# Patient Record
Sex: Male | Born: 1990 | Race: Black or African American | Hispanic: No | Marital: Single | Smoking: Never smoker
Health system: Southern US, Community
[De-identification: ages and names within clinical notes are randomized; demographics above are authoritative.]

## PROBLEM LIST (undated history)

## (undated) DIAGNOSIS — J45909 Unspecified asthma, uncomplicated: Secondary | ICD-10-CM

---

## 2012-05-02 ENCOUNTER — Encounter (HOSPITAL_COMMUNITY): Payer: Self-pay | Admitting: Emergency Medicine

## 2012-05-02 ENCOUNTER — Emergency Department (HOSPITAL_COMMUNITY)
Admission: EM | Admit: 2012-05-02 | Discharge: 2012-05-02 | Disposition: A | Discharge status: Home / Self Care | Payer: 59 | Source: Home / Self Care | Attending: Family Medicine | Admitting: Family Medicine

## 2012-05-02 DIAGNOSIS — J45990 Exercise induced bronchospasm: Secondary | ICD-10-CM

## 2012-05-02 HISTORY — DX: Unspecified asthma, uncomplicated: J45.909

## 2012-05-02 MED ORDER — ALBUTEROL SULFATE HFA 108 (90 BASE) MCG/ACT IN AERS: 1.0000 | INHALATION_SPRAY | Freq: Four times a day (QID) | RESPIRATORY_TRACT | Status: DC | PRN

## 2012-05-02 NOTE — ED Notes (Signed)
Pt is needing refill on his ventolin Denies any medical problems He is alert w/no signs of acute distress.

## 2012-05-02 NOTE — ED Provider Notes (Signed)
History     CSN: [id number]  Arrival date & time 05/02/12  1412   First MD Initiated Contact with Patient 05/02/12 1422      Chief Complaint  Patient presents with  . Medication Refill    (Consider location/radiation/quality/duration/timing/severity/associated sxs/prior treatment) Patient is a 22 y.o. male presenting with shortness of breath. The history is provided by the patient and a parent.  Shortness of Breath  The current episode started today. The onset was gradual. Progression since onset: pt with EIA and asthma since child, , has run out of mdi. The problem is mild. Associated symptoms include shortness of breath and wheezing. Pertinent negatives include no cough.    Past Medical History  Diagnosis Date  . Asthma     History reviewed. No pertinent past surgical history.  No family history on file.  History  Substance Use Topics  . Smoking status: Never Smoker   . Smokeless tobacco: Not on file  . Alcohol Use: No      Review of Systems  Constitutional: Negative.   Respiratory: Positive for shortness of breath and wheezing. Negative for cough.     Allergies  Review of patient's allergies indicates no known allergies.  Home Medications   Current Outpatient Rx  Name  Route  Sig  Dispense  Refill  . ALBUTEROL SULFATE HFA 108 (90 BASE) MCG/ACT IN AERS   Inhalation   Inhale 2 puffs into the lungs every 6 (six) hours as needed.         . ALBUTEROL SULFATE HFA 108 (90 BASE) MCG/ACT IN AERS   Inhalation   Inhale 1-2 puffs into the lungs every 6 (six) hours as needed for wheezing. Or 30 minutes before exercise.   1 Inhaler   0     BP 135/77  Pulse 71  Temp 98.4 F (36.9 C) (Oral)  Resp 17  SpO2 100%  Physical Exam  Nursing note and vitals reviewed. Constitutional: He is oriented to person, place, and time. He appears well-developed and well-nourished.  Neck: Normal range of motion. Neck supple.  Pulmonary/Chest: Effort normal and breath sounds  normal.  Lymphadenopathy:    He has no cervical adenopathy.  Neurological: He is alert and oriented to person, place, and time.  Skin: Skin is warm and dry.    ED Course  Procedures (including critical care time)  Labs Reviewed - No data to display No results found.   1. Asthma, exercise induced       MDM          Linna Hoff, MD 05/02/12 1625

## 2012-10-01 ENCOUNTER — Encounter: Payer: Self-pay | Admitting: Internal Medicine

## 2012-10-01 ENCOUNTER — Ambulatory Visit (INDEPENDENT_AMBULATORY_CARE_PROVIDER_SITE_OTHER): Payer: 59 | Admitting: Internal Medicine

## 2012-10-01 VITALS — BP 122/80 | HR 57 | Temp 98.4°F | Resp 18 | Ht 69.0 in | Wt 157.0 lb

## 2012-10-01 DIAGNOSIS — J45990 Exercise induced bronchospasm: Secondary | ICD-10-CM

## 2012-10-01 DIAGNOSIS — Z0000 Encounter for general adult medical examination without abnormal findings: Secondary | ICD-10-CM

## 2012-10-01 MED ORDER — ALBUTEROL SULFATE HFA 108 (90 BASE) MCG/ACT IN AERS: 1.0000 | INHALATION_SPRAY | Freq: Four times a day (QID) | RESPIRATORY_TRACT | Status: DC | PRN

## 2012-10-01 NOTE — Patient Instructions (Signed)
Health Maintenance, 66- to 20-Year-Old SCHOOL PERFORMANCE After high school completion, the young adult may be attending college, Scientist, product/process development or vocational school, or entering the [employer] or the work force. SOCIAL AND EMOTIONAL DEVELOPMENT The young adult establishes adult relationships and explores sexual identity. Young adults may be living at home or in a college dorm or apartment. Increasing independence is important with young adults. Throughout adolescence, teens should assume responsibility of their own health care. IMMUNIZATIONS Most young adults should be fully vaccinated. A booster dose of Tdap (tetanus, diphtheria, and pertussis, or "whooping cough"), a dose of meningococcal vaccine to protect against a certain type of bacterial meningitis, hepatitis A, human papillomarvirus (HPV), chickenpox, or measles vaccines may be indicated, if not given at an earlier age. Annual influenza or "flu" vaccination should be considered during flu season.  TESTING Annual screening for vision and hearing problems is recommended. Vision should be screened objectively at least once between 45 and 51 years of age. The young adult may be screened for anemia or tuberculosis. Young adults should have a blood test to check for high cholesterol during this time period. Young adults should be screened for use of alcohol and drugs. If the young adult is sexually active, screening for sexually transmitted infections, pregnancy, or HIV may be performed. Screening for cervical cancer should be performed within 3 years of beginning sexual activity. NUTRITION AND ORAL HEALTH  Adequate calcium intake is important. Consume 3 servings of low-fat milk and dairy products daily. For those who do not drink milk or consume dairy products, calcium enriched foods, such as juice, bread, or cereal, dark, leafy greens, or canned fish are alternate sources of calcium.  Drink plenty of water. Limit fruit juice to 8 to 12 ounces per day.  Avoid sugary beverages or sodas.  Discourage skipping meals, especially breakfast. Teens should eat a good variety of vegetables and fruits, as well as lean meats.  Avoid high fat, high salt, and high sugar foods, such as candy, chips, and cookies.  Encourage young adults to participate in meal planning and preparation.  Eat meals together as a family whenever possible. Encourage conversation at mealtime.  Limit fast food choices and eating out at restaurants.  Brush teeth twice a day and floss.  Schedule dental exams twice a year. SLEEP Regular sleep habits are important. PHYSICAL, SOCIAL, AND EMOTIONAL DEVELOPMENT  One hour of regular physical activity daily is recommended. Continue to participate in sports.  Encourage young adults to develop their own interests and consider community service or volunteerism.  Provide guidance to the young adult in making decisions about college and work plans.  Make sure that young adults know that they should never be in a situation that makes them uncomfortable, and they should tell partners if they do not want to engage in sexual activity.  Talk to the young adult about body image. Eating disorders may be noted at this time. Young adults may also be concerned about being overweight. Monitor the young adult for weight gain or loss.  Mood disturbances, depression, anxiety, alcoholism, or attention problems may be noted in young adults. Talk to the caregiver if there are concerns about mental illness.  Negotiate limit setting and independent decision making.  Encourage the young adult to handle conflict without physical violence.  Avoid loud noises which may impair hearing.  Limit television and computer time to 2 hours per day. Individuals who engage in excessive sedentary activity are more likely to become overweight. RISK BEHAVIORS  Sexually active  young adults need to take precautions against pregnancy and sexually transmitted  infections. Talk to young adults about contraception.  Provide a tobacco-free and drug-free environment for the young adult. Talk to the young adult about drug, tobacco, and alcohol use among friends or at friends' homes. Make sure the young adult knows that smoking tobacco or marijuana and taking drugs have health consequences and may impact brain development.  Teach the young adult about appropriate use of over-the-counter or prescription medicines.  Establish guidelines for driving and for riding with friends.  Talk to young adults about the risks of drinking and driving or boating. Encourage the young adult to call you if he or she or friends have been drinking or using drugs.  Remind young adults to wear seat belts at all times in cars and life vests in boats.  Young adults should always wear a properly fitted helmet when they are riding a bicycle.  Use caution with all-terrain vehicles (ATVs) or other motorized vehicles.  Do not keep handguns in the home. (If you do, the gun and ammunition should be locked separately and out of the young adult's access.)  Equip your home with smoke detectors and change the batteries regularly. Make sure all family members know the fire escape plans for your home.  Teach young adults not to swim alone and not to dive in shallow water.  All individuals should wear sunscreen that protects against UVA and UVB light with at least a sun protection factor (SPF) of 30 when out in the sun. This minimizes sun burning. WHAT'S NEXT? Young adults should visit their pediatrician or family physician yearly. By young adulthood, health care should be transitioned to a family physician or internal medicine specialist. Sexually active females may want to begin annual physical exams with a gynecologist. Document Released: 06/20/2006 Document Revised: 06/17/2011 Document Reviewed: 07/10/2006 [facility] Patient Information 2014 [place], [place]. Exercise-Induced Asthma   Asthma is a condition in which the airways in the lungs (bronchioles) tend to constrict more than normal due to muscle spasms. This constriction results in difficulty in breathing (shortness of breath, wheezing, or coughing). For some people the symptoms are caused or triggered by physical activity; this is known as exercise-induced asthma. SYMPTOMS   Shortness of breath.  Wheezing.  Coughing.  Chest tightness.  Decrease in optimal performance.  Fatigue. POSSIBLE TRIGGERS: Exercise-induced asthma may occur more often when one or more of the following are present:   Animal dander from the skin, hair, or feathers of animals.  Dust mites contained in house dust.  Cockroaches.  Pollen from trees or grass.  Mold.  Cigarette or tobacco smoke. Smoking cannot be allowed in homes of people with asthma. People with asthma should not smoke and should not be around smokers.  Air pollutants such as dust, household cleaners, hair sprays, aerosol sprays, paint fumes, strong chemicals, or strong odors.  Cold air or weather changes. Cold air may cause inflammation. Winds increase molds and pollens in the air. There is not one best climate for people with asthma.  Strong emotions such as crying or laughing hard.  Stress.  Certain medicines such as aspirin or beta-blockers.  Sulfites in such foods and drinks as dried fruits and wine.  Infections or inflammatory conditions such as the flu, a cold, or an inflammation of the nasal membranes (rhinitis).  Gastroesophageal reflux disease (GERD). GERD is a condition where stomach acid backs up into your throat (esophagus).  Exercise or strenous activity. Proper pre-exercise medicines allow most  people to participate in sports. PREVENTION   Know the triggers that may increase your occurrence for exercise-induced asthma and avoid them.  During winter you may need to exercise indoors or wear a mask if you do exercise outdoors.  Breathing  through the nose instead of the mouth, especially in the winter.  Warm-up for an appropriate length of time before a vigorous workout.  Take controller and reliever medicines to control your asthma as directed.  Follow-up with your caregiver as directed. TREATMENT  Asthma controller and reliever medicines work well for most people suffering from exercise-induced asthma. Medicines are able to both prevent asthma attack, as well as treat attacks already happening. The most common type of medicine for asthma is called a bronchodilator. Bronchodilators act by expanding the constricted airways. The most common type of bronchodilator is albuterol and should be taken 15 to 30 minutes before physical activity and as soon as symptoms begin to appear. Additional medicines, such as cromolyn and nedocromil, may be prescribed by your caregiver. It is important for all people with asthma to use their medicines as directed by their caregiver. Document Released: 03/25/2005 Document Revised: 03/11/2012 Document Reviewed: 07/07/2008 [facility] Patient Information 2014 [place], [place].

## 2012-10-01 NOTE — Progress Notes (Signed)
  Subjective:    Patient ID: Adam Church., male    DOB: May 09, 1990, 22 y.o.   MRN: [id number]  HPI  22 year old patient who is seen today to establish with our practice. He has a history of exercise induced asthma which has been fairly stable;  he does use albuterol but fairly infrequently.  Past medical history is unremarkable The hospital mentions No surgeries  Social history  [place] resident Graduated [employer] goes to  [employer] major Nonsmoker/regular exercise at a health club  Family history noncontributory father age 20 mother age 59 one younger brother all in excellent health    Review of Systems  Constitutional: Negative for fever, chills, activity change, appetite change and fatigue.  HENT: Negative for hearing loss, ear pain, congestion, rhinorrhea, sneezing, mouth sores, trouble swallowing, neck pain, neck stiffness, dental problem, voice change, sinus pressure and tinnitus.   Eyes: Negative for photophobia, pain, redness and visual disturbance.  Respiratory: Positive for wheezing. Negative for apnea, cough, choking, chest tightness and shortness of breath.   Cardiovascular: Negative for chest pain, palpitations and leg swelling.  Gastrointestinal: Negative for nausea, vomiting, abdominal pain, diarrhea, constipation, blood in stool, abdominal distention, anal bleeding and rectal pain.  Genitourinary: Negative for dysuria, urgency, frequency, hematuria, flank pain, decreased urine volume, discharge, penile swelling, scrotal swelling, difficulty urinating, genital sores and testicular pain.  Musculoskeletal: Negative for myalgias, back pain, joint swelling, arthralgias and gait problem.  Skin: Negative for color change, rash and wound.  Neurological: Negative for dizziness, tremors, seizures, syncope, facial asymmetry, speech difficulty, weakness, light-headedness, numbness and headaches.  Hematological: Negative for adenopathy. Does not bruise/bleed  easily.  Psychiatric/Behavioral: Negative for suicidal ideas, hallucinations, behavioral problems, confusion, sleep disturbance, self-injury, dysphoric mood, decreased concentration and agitation. The patient is not nervous/anxious.        Objective:   Physical Exam  Constitutional: He appears well-developed and well-nourished.  HENT:  Head: Normocephalic and atraumatic.  Right Ear: External ear normal.  Left Ear: External ear normal.  Nose: Nose normal.  Mouth/Throat: Oropharynx is clear and moist.  Eyes: Conjunctivae and EOM are normal. Pupils are equal, round, and reactive to light. No scleral icterus.  Neck: Normal range of motion. Neck supple. No JVD present. No thyromegaly present.  Cardiovascular: Regular rhythm, normal heart sounds and intact distal pulses.  Exam reveals no gallop and no friction rub.   No murmur heard. Pulmonary/Chest: Effort normal and breath sounds normal. He exhibits no tenderness.  Abdominal: Soft. Bowel sounds are normal. He exhibits no distension and no mass. There is no tenderness.  Genitourinary: Penis normal.  Musculoskeletal: Normal range of motion. He exhibits no edema and no tenderness.  Lymphadenopathy:    He has no cervical adenopathy.  Neurological: He is alert. He has normal reflexes. No cranial nerve deficit. Coordination normal.  Skin: Skin is warm and dry. No rash noted.  Tattoos both upper outer arms  Psychiatric: He has a normal mood and affect. His behavior is normal.          Assessment & Plan:  Preventive health examination History of exercise-induced asthma stable. We'll continue when necessary albuterol  Return here as needed

## 2013-10-21 ENCOUNTER — Ambulatory Visit (INDEPENDENT_AMBULATORY_CARE_PROVIDER_SITE_OTHER): Payer: 59 | Admitting: Internal Medicine

## 2013-10-21 ENCOUNTER — Encounter: Payer: Self-pay | Admitting: Internal Medicine

## 2013-10-21 VITALS — BP 120/76 | HR 72 | Temp 98.1°F | Resp 18 | Ht 69.0 in | Wt 159.0 lb

## 2013-10-21 DIAGNOSIS — L74519 Primary focal hyperhidrosis, unspecified: Secondary | ICD-10-CM

## 2013-10-21 DIAGNOSIS — L74510 Primary focal hyperhidrosis, axilla: Secondary | ICD-10-CM | POA: Insufficient documentation

## 2013-10-21 MED ORDER — ALUMINUM CHLORIDE 20 % EX SOLN: Freq: Every day | CUTANEOUS | Status: DC

## 2013-10-21 NOTE — Patient Instructions (Addendum)
Diaphoresis Sweating is controlled by our nervous system. Sweat glands are found in the skin throughout the body. They exist in higher numbers in the skin of the hands, feet, armpits, and the genital region. Sweating occurs normally when the temperature of your body goes up. Diaphoresis means profuse sweating or perspiration due to an underlying medical condition or an external factor (such as medicines). Hyperhidrosis means excessive sweating that is not usually due to an underlying medical condition, on areas such as the palms, soles, or armpits. Other areas of the body may also be affected. Hyperhidrosis usually begins in childhood or early adolescence. It increases in severity through puberty and into adulthood. Sweaty palms are the most common problem and the most bothersome to people who have hyperhidrosis. CAUSES  Sweating is normally seen with exercise or being in hot surroundings. Not sweating in these conditions would be harmful. Stressful situations can also cause sweating. In some people, stimulation of the sweat glands during stress is overactive. Talking to strangers or shaking someone's hand can produce profuse sweating. Causes of sweating include:  Emotional upset.  Low blood pressure.  Low blood sugar.  Heart problems.  Low blood cell counts.  Certain pain relieving medicines.  Exercise.  Alcohol.  Infection.  Caffeine.  Spicy foods.  Hot flashes.  Overactive thyroid.  Illegal drug use, such as cocaine and amphetamine.  Use of medicines that stimulate parts of the nervous system.  A tumor (pheochromocytoma).  Withdrawal from some medicines or alcohol. DIAGNOSIS  Your caregiver needs to be consulted to make sure excessive sweating is not caused by another condition. Further testing may need to be done. TREATMENT   When hyperhidrosis is caused by another condition, that condition should be treated.  If menopause is the cause, you may wish to talk to your  caregiver about estrogen replacement.  If the hyperhidrosis is a natural happening of the way your body works, certain antiperspirants may help.  If medicines do not work, injections of botulinum toxin type A are sometimes used for underarm sweating.  Your caregiver can usually help you with this problem. Document Released: 10/15/2004 Document Revised: 06/17/2011 Document Reviewed: 05/02/2008 [facility] Patient Information 2015 [place], [place]. This information is not intended to replace advice given to you by your health care provider. Make sure you discuss any questions you have with your health care provider.  Call or return to clinic prn if these symptoms worsen or fail to improve as anticipated.

## 2013-10-21 NOTE — Progress Notes (Signed)
   Subjective:    Patient ID: Adam ChurchJames C Dolley Jr., male    DOB: 09/25/1990, 22 y.o.   MRN: [id number]  HPI 23 year old patient who has a long history of excessive sweating from the axilla areas He also has a history of asthma, which has been quite stable.  Past Medical History  Diagnosis Date  . Asthma     History   Social History  . Marital Status: Single    Spouse Name: N/A    Number of Children: N/A  . Years of Education: N/A   Occupational History  . Not on file.   Social History Main Topics  . Smoking status: Never Smoker   . Smokeless tobacco: Never Used  . Alcohol Use: No  . Drug Use: No  . Sexual Activity: Not on file   Other Topics Concern  . Not on file   Social History Narrative  . No narrative on file    History reviewed. No pertinent past surgical history.  No family history on file.  No Known Allergies  Current Outpatient Prescriptions on File Prior to Visit  Medication Sig Dispense Refill  . albuterol (PROVENTIL HFA;VENTOLIN HFA) 108 (90 BASE) MCG/ACT inhaler Inhale 1-2 puffs into the lungs every 6 (six) hours as needed for wheezing. Or 30 minutes before exercise.  1 Inhaler  4   No current facility-administered medications on file prior to visit.    BP 120/76  Pulse 72  Temp(Src) 98.1 F (36.7 C) (Oral)  Resp 18  Ht 5\' 9"  (1.753 m)  Wt 159 lb (72.122 kg)  BMI 23.47 kg/m2  SpO2 98%      Review of Systems  Constitutional: Negative for fever, chills, appetite change and fatigue.  HENT: Negative for congestion, dental problem, ear pain, hearing loss, sore throat, tinnitus, trouble swallowing and voice change.   Eyes: Negative for pain, discharge and visual disturbance.  Respiratory: Negative for cough, chest tightness, wheezing and stridor.   Cardiovascular: Negative for chest pain, palpitations and leg swelling.  Gastrointestinal: Negative for nausea, vomiting, abdominal pain, diarrhea, constipation, blood in stool and abdominal  distention.  Genitourinary: Negative for urgency, hematuria, flank pain, discharge, difficulty urinating and genital sores.  Musculoskeletal: Negative for arthralgias, back pain, gait problem, joint swelling, myalgias and neck stiffness.  Skin: Negative for rash.       Excessive axillary sweating  Neurological: Negative for dizziness, syncope, speech difficulty, weakness, numbness and headaches.  Hematological: Negative for adenopathy. Does not bruise/bleed easily.  Psychiatric/Behavioral: Negative for behavioral problems and dysphoric mood. The patient is not nervous/anxious.        Objective:   Physical Exam  Constitutional: He is oriented to person, place, and time. He appears well-developed.  HENT:  Head: Normocephalic.  Right Ear: External ear normal.  Left Ear: External ear normal.  Eyes: Conjunctivae and EOM are normal.  Neck: Normal range of motion.  Cardiovascular: Normal rate and normal heart sounds.   Pulmonary/Chest: Breath sounds normal.  Abdominal: Bowel sounds are normal.  Musculoskeletal: Normal range of motion. He exhibits no edema and no tenderness.  Neurological: He is alert and oriented to person, place, and time.  Psychiatric: He has a normal mood and affect. His behavior is normal.          Assessment & Plan:   Axillary hyperhidrosis.  We'll give a prescription for Drysol.  Information dispensed

## 2013-10-21 NOTE — Progress Notes (Signed)
Pre visit review using our clinic review tool, if applicable. No additional management support is needed unless otherwise documented below in the visit note. 

## 2015-12-14 ENCOUNTER — Ambulatory Visit (INDEPENDENT_AMBULATORY_CARE_PROVIDER_SITE_OTHER): Payer: 59 | Admitting: Family Medicine

## 2015-12-14 ENCOUNTER — Encounter: Payer: Self-pay | Admitting: Family Medicine

## 2015-12-14 VITALS — BP 100/70 | HR 77 | Temp 98.2°F | Ht 69.0 in | Wt 159.0 lb

## 2015-12-14 DIAGNOSIS — J4521 Mild intermittent asthma with (acute) exacerbation: Secondary | ICD-10-CM | POA: Diagnosis not present

## 2015-12-14 DIAGNOSIS — J40 Bronchitis, not specified as acute or chronic: Secondary | ICD-10-CM | POA: Diagnosis not present

## 2015-12-14 MED ORDER — ALBUTEROL SULFATE HFA 108 (90 BASE) MCG/ACT IN AERS: 1.0000 | INHALATION_SPRAY | RESPIRATORY_TRACT | 1 refills | Status: DC | PRN

## 2015-12-14 MED ORDER — PREDNISONE 20 MG PO TABS: 40.0000 mg | ORAL_TABLET | Freq: Every day | ORAL | 0 refills | Status: DC

## 2015-12-14 NOTE — Patient Instructions (Signed)
Take the prednisone as instructed, 2 tablets (40mg ) daily for 4 days.  Use the albuterol as needed per instructions.  Seek care immediately if worsening, new symptoms or symptoms persist despite treatment.

## 2015-12-14 NOTE — Progress Notes (Signed)
   HPI:  Adam ChurchJames C Sesma Jr. is a pleasant 25 year old with past medical history of reported asthma here for an acute visit for "albuterol refill." -Symptoms started: About 4 days ago -symptoms:nasal congestion, sore throat, cough, wheezing, occasional shortness of breath -denies:fever, NVD, tooth pain -has tried: Albuterol, but realized that it is expired and he hadn't used it in a year or 2 -sick contacts/travel/risks: no reported flu, strep or tick exposure -Hx of: allergies and asthma; reports in the past he would use albuterol for similar symptoms when he had a cold, allergy issues or with exercise  ROS: See pertinent positives and negatives per HPI.  Past Medical History:  Diagnosis Date  . Asthma     No past surgical history on file.  No family history on file.  Social History   Social History  . Marital status: Single    Spouse name: N/A  . Number of children: N/A  . Years of education: N/A   Social History Main Topics  . Smoking status: Never Smoker  . Smokeless tobacco: Never Used  . Alcohol use No  . Drug use: No  . Sexual activity: Not Asked   Other Topics Concern  . None   Social History Narrative  . None     Current Outpatient Prescriptions:  .  albuterol (PROVENTIL HFA;VENTOLIN HFA) 108 (90 Base) MCG/ACT inhaler, Inhale 1-2 puffs into the lungs every 4 (four) hours as needed for wheezing. Or 30 minutes before exercise., Disp: 1 Inhaler, Rfl: 1 .  predniSONE (DELTASONE) 20 MG tablet, Take 2 tablets (40 mg total) by mouth daily with breakfast., Disp: 8 tablet, Rfl: 0  EXAM:  Vitals:   12/14/15 1409  BP: 100/70  Pulse: 77  Temp: 98.2 F (36.8 C)    Body mass index is 23.48 kg/m.  GENERAL: vitals reviewed and listed above, alert, oriented, appears well hydrated and in no acute distress  HEENT: atraumatic, conjunttiva clear, no obvious abnormalities on inspection of external nose and ears, normal appearance of ear canals and TMs, clear nasal  congestion, mild post oropharyngeal erythema with PND, no tonsillar edema or exudate, no sinus TTP  NECK: no obvious masses on inspection  LUNGS: Diffuse expiratory wheeze, no signs of respiratory distress, oxygen sats normal  CV: HRRR, no peripheral edema  MS: moves all extremities without noticeable abnormality  PSYCH: pleasant and cooperative, no obvious depression or anxiety  ASSESSMENT AND PLAN:  Discussed the following assessment and plan:  Asthma with acute exacerbation, mild intermittent  Bronchitis  - We discussed potential etiologies, with VURI with asthma exacerbation being most likely.  We discussed treatment side effects, likely course, antibiotic misuse, transmission, and signs of developing a serious illness. Opted to treat with prednisone and when necessary albuterol. -of course, we advised to return or notify a doctor immediately if symptoms worsen or persist or new concerns arise.    Patient Instructions  Take the prednisone as instructed, 2 tablets (40mg ) daily for 4 days.  Use the albuterol as needed per instructions.  Seek care immediately if worsening, new symptoms or symptoms persist despite treatment.   Kriste BasqueKIM, Sherril Heyward R., DO

## 2015-12-14 NOTE — Progress Notes (Signed)
Pre visit review using our clinic review tool, if applicable. No additional management support is needed unless otherwise documented below in the visit note. 

## 2017-04-03 ENCOUNTER — Ambulatory Visit: Payer: 59 | Admitting: Adult Health

## 2017-04-03 ENCOUNTER — Encounter: Payer: Self-pay | Admitting: Adult Health

## 2017-04-03 VITALS — BP 118/72 | Temp 98.6°F | Wt 164.0 lb

## 2017-04-03 DIAGNOSIS — J45990 Exercise induced bronchospasm: Secondary | ICD-10-CM | POA: Diagnosis not present

## 2017-04-03 MED ORDER — ALBUTEROL SULFATE HFA 108 (90 BASE) MCG/ACT IN AERS: 1.0000 | INHALATION_SPRAY | RESPIRATORY_TRACT | 2 refills | Status: DC | PRN

## 2017-04-03 NOTE — Progress Notes (Signed)
   Subjective:    Patient ID: Adam ChurchJames C Squillace Jr., male    DOB: September 07, 1990, 26 y.o.   MRN: [id number]  HPI 26 year old male who  has a past medical history of Asthma. He presents to the office today for refill of his rescue inhaler. He uses it PRN when he exercises. His last refill on this was in September 2017.   He reports that he is not currently having any symptoms, just needs a refill.   Review of Systems See HPI   Past Medical History:  Diagnosis Date  . Asthma     Social History   Socioeconomic History  . Marital status: Single    Spouse name: Not on file  . Number of children: Not on file  . Years of education: Not on file  . Highest education level: Not on file  Social Needs  . Financial resource strain: Not on file  . Food insecurity - worry: Not on file  . Food insecurity - inability: Not on file  . Transportation needs - medical: Not on file  . Transportation needs - non-medical: Not on file  Occupational History  . Not on file  Tobacco Use  . Smoking status: Never Smoker  . Smokeless tobacco: Never Used  Substance and Sexual Activity  . Alcohol use: No  . Drug use: No  . Sexual activity: Not on file  Other Topics Concern  . Not on file  Social History Narrative  . Not on file    History reviewed. No pertinent surgical history.  History reviewed. No pertinent family history.  No Known Allergies  Current Outpatient Medications on File Prior to Visit  Medication Sig Dispense Refill  . albuterol (PROVENTIL HFA;VENTOLIN HFA) 108 (90 Base) MCG/ACT inhaler Inhale 1-2 puffs into the lungs every 4 (four) hours as needed for wheezing. Or 30 minutes before exercise. (Patient not taking: Reported on 04/03/2017) 1 Inhaler 1   No current facility-administered medications on file prior to visit.     BP 118/72 (BP Location: Left Arm)   Temp 98.6 F (37 C) (Oral)   Wt 164 lb (74.4 kg)   BMI 24.22 kg/m       Objective:   Physical Exam  Constitutional: He  is oriented to person, place, and time. He appears well-developed and well-nourished. No distress.  Cardiovascular: Normal rate, regular rhythm, normal heart sounds and intact distal pulses. Exam reveals no gallop and no friction rub.  No murmur heard. Pulmonary/Chest: Effort normal and breath sounds normal. No respiratory distress. He has no wheezes. He has no rales. He exhibits no tenderness.  Neurological: He is alert and oriented to person, place, and time.  Skin: Skin is warm and dry. No rash noted. No erythema. No pallor.  Psychiatric: He has a normal mood and affect. His behavior is normal. Judgment and thought content normal.  Nursing note and vitals reviewed.     Assessment & Plan:  1. Exercise-induced asthma - albuterol (PROVENTIL HFA;VENTOLIN HFA) 108 (90 Base) MCG/ACT inhaler; Inhale 1-2 puffs into the lungs every 4 (four) hours as needed for wheezing. Or 30 minutes before exercise.  Dispense: 1 Inhaler; Refill: 2 - Follow up as needed  Shirline Freesory Rishon Thilges, NP

## 2017-04-04 ENCOUNTER — Telehealth: Payer: Self-pay | Admitting: Internal Medicine

## 2017-04-04 NOTE — Telephone Encounter (Signed)
I called [facility] and gave verbal order, it was approved on 04/03/2017.

## 2017-04-04 NOTE — Telephone Encounter (Signed)
Copied from CRM [phone]#[id number]. Topic: Quick Communication - See Telephone Encounter >> Apr 04, 2017 11:27 AM Diana EvesHoyt, Maryann B wrote: CRM for notification. See Telephone encounter for:  albuterol inhaler needing to be sent to pharmacy vs pt coming to pick up script. Pt at pharmacy now 04/04/17.

## 2018-05-12 ENCOUNTER — Encounter: Payer: Self-pay | Admitting: Adult Health

## 2018-05-12 ENCOUNTER — Ambulatory Visit (INDEPENDENT_AMBULATORY_CARE_PROVIDER_SITE_OTHER): Payer: 59 | Admitting: Adult Health

## 2018-05-12 ENCOUNTER — Other Ambulatory Visit (HOSPITAL_COMMUNITY)
Admission: RE | Admit: 2018-05-12 | Discharge: 2018-05-12 | Disposition: A | Discharge status: Home / Self Care | Payer: 59 | Source: Ambulatory Visit | Attending: Adult Health | Admitting: Adult Health

## 2018-05-12 VITALS — BP 106/66 | Temp 97.9°F | Ht 68.75 in | Wt 172.0 lb

## 2018-05-12 DIAGNOSIS — J45990 Exercise induced bronchospasm: Secondary | ICD-10-CM

## 2018-05-12 DIAGNOSIS — T148XXA Other injury of unspecified body region, initial encounter: Secondary | ICD-10-CM | POA: Diagnosis not present

## 2018-05-12 DIAGNOSIS — Z113 Encounter for screening for infections with a predominantly sexual mode of transmission: Secondary | ICD-10-CM

## 2018-05-12 DIAGNOSIS — Z0000 Encounter for general adult medical examination without abnormal findings: Secondary | ICD-10-CM

## 2018-05-12 LAB — CBC WITH DIFFERENTIAL/PLATELET
BASOS ABS: 0 10*3/uL (ref 0.0–0.1)
Basophils Relative: 0.3 % (ref 0.0–3.0)
EOS PCT: 1.2 % (ref 0.0–5.0)
Eosinophils Absolute: 0.1 10*3/uL (ref 0.0–0.7)
HCT: 41.6 % (ref 39.0–52.0)
HEMOGLOBIN: 13.4 g/dL (ref 13.0–17.0)
LYMPHS PCT: 30.4 % (ref 12.0–46.0)
Lymphs Abs: 2.5 10*3/uL (ref 0.7–4.0)
MCHC: 32.1 g/dL (ref 30.0–36.0)
MCV: 71.5 fl — AB (ref 78.0–100.0)
MONOS PCT: 7.8 % (ref 3.0–12.0)
Monocytes Absolute: 0.6 10*3/uL (ref 0.1–1.0)
NEUTROS PCT: 60.3 % (ref 43.0–77.0)
Neutro Abs: 5 10*3/uL (ref 1.4–7.7)
Platelets: 235 10*3/uL (ref 150.0–400.0)
RBC: 5.82 Mil/uL — AB (ref 4.22–5.81)
RDW: 14.8 % (ref 11.5–15.5)
WBC: 8.2 10*3/uL (ref 4.0–10.5)

## 2018-05-12 LAB — HEPATIC FUNCTION PANEL
ALBUMIN: 4.6 g/dL (ref 3.5–5.2)
ALT: 34 U/L (ref 0–53)
AST: 28 U/L (ref 0–37)
Alkaline Phosphatase: 57 U/L (ref 39–117)
Bilirubin, Direct: 0.1 mg/dL (ref 0.0–0.3)
Total Bilirubin: 0.7 mg/dL (ref 0.2–1.2)
Total Protein: 7 g/dL (ref 6.0–8.3)

## 2018-05-12 LAB — BASIC METABOLIC PANEL
BUN: 18 mg/dL (ref 6–23)
CALCIUM: 9.9 mg/dL (ref 8.4–10.5)
CO2: 29 mEq/L (ref 19–32)
Chloride: 102 mEq/L (ref 96–112)
Creatinine, Ser: 1.01 mg/dL (ref 0.40–1.50)
GFR: 108.51 mL/min (ref >60.00)
Glucose, Bld: 108 mg/dL — ABNORMAL HIGH (ref 70–99)
POTASSIUM: 4 meq/L (ref 3.5–5.1)
SODIUM: 138 meq/L (ref 135–145)

## 2018-05-12 LAB — TSH: TSH: 1.81 u[IU]/mL (ref 0.35–4.50)

## 2018-05-12 MED ORDER — ALBUTEROL SULFATE HFA 108 (90 BASE) MCG/ACT IN AERS: 1.0000 | INHALATION_SPRAY | RESPIRATORY_TRACT | 2 refills | Status: DC | PRN

## 2018-05-12 NOTE — Patient Instructions (Signed)
It was great seeing you today   Someone from PT will call you to schedule your appointment   We will follow up with you regarding your blood work

## 2018-05-12 NOTE — Progress Notes (Signed)
Subjective:    Patient ID: Adam Jensen., male    DOB: 03-24-1991, 28 y.o.   MRN: [id number]  HPI  Patient presents for yearly preventative medicine examination. He is a pleasant 28 year old male who  has a past medical history of Asthma.  Exercise Induced Asthma - Uses albuterol inhaler PRN. Does not use frequently.   Left gluteal pain - found out that his left leg is a 1/4 inch shorter than his right. He has been seeing a chiropractor for low back pain which has since resolved. He continues to have pain in his left gluteal that feels "tight" this has been present for about 1 year.  Pain is worse with bending, twisting, and exercising   All immunizations and health maintenance protocols were reviewed with the patient and needed orders were placed. Refused flu and tdap   Appropriate screening laboratory values were ordered for the patient including screening of hyperlipidemia, renal function and hepatic function.  Medication reconciliation,  past medical history, social history, problem list and allergies were reviewed in detail with the patient  Goals were established with regard to weight loss, exercise, and  diet in compliance with medications. Is very active and exercises multiple times a week. Is eating healthy.   Review of Systems  Constitutional: Negative.   HENT: Negative.   Eyes: Negative.   Respiratory: Negative.   Cardiovascular: Negative.   Gastrointestinal: Negative.   Endocrine: Negative.   Genitourinary: Negative.   Musculoskeletal: Positive for myalgias.  Skin: Negative.   Allergic/Immunologic: Negative.   Neurological: Negative.   Hematological: Negative.   Psychiatric/Behavioral: Negative.   All other systems reviewed and are negative.  Past Medical History:  Diagnosis Date  . Asthma     Social History   Socioeconomic History  . Marital status: Single    Spouse name: Not on file  . Number of children: Not on file  . Years of education: Not on  file  . Highest education level: Not on file  Occupational History  . Not on file  Social Needs  . Financial resource strain: Not on file  . Food insecurity:    Worry: Not on file    Inability: Not on file  . Transportation needs:    Medical: Not on file    Non-medical: Not on file  Tobacco Use  . Smoking status: Never Smoker  . Smokeless tobacco: Never Used  Substance and Sexual Activity  . Alcohol use: No  . Drug use: No  . Sexual activity: Not on file  Lifestyle  . Physical activity:    Days per week: Not on file    Minutes per session: Not on file  . Stress: Not on file  Relationships  . Social connections:    Talks on phone: Not on file    Gets together: Not on file    Attends religious service: Not on file    Active member of club or organization: Not on file    Attends meetings of clubs or organizations: Not on file    Relationship status: Not on file  . Intimate partner violence:    Fear of current or ex partner: Not on file    Emotionally abused: Not on file    Physically abused: Not on file    Forced sexual activity: Not on file  Other Topics Concern  . Not on file  Social History Narrative  . Not on file    History reviewed. No pertinent surgical history.  History reviewed. No pertinent family history.  No Known Allergies  No current outpatient medications on file prior to visit.   No current facility-administered medications on file prior to visit.     BP 106/66   Temp 97.9 F (36.6 C)   Ht 5' 8.75" (1.746 m) Comment: WITHOUT SHOES  Wt 172 lb (78 kg)   BMI 25.59 kg/m       Objective:   Physical Exam Vitals signs and nursing note reviewed.  Constitutional:      Appearance: Normal appearance. He is normal weight.  HENT:     Head: Normocephalic and atraumatic.     Right Ear: Tympanic membrane, ear canal and external ear normal.     Left Ear: Tympanic membrane, ear canal and external ear normal.     Nose: Nose normal. No congestion or  rhinorrhea.     Mouth/Throat:     Mouth: Mucous membranes are moist.  Cardiovascular:     Rate and Rhythm: Normal rate and regular rhythm.     Pulses: Normal pulses.  Pulmonary:     Effort: Pulmonary effort is normal.     Breath sounds: Normal breath sounds.  Abdominal:     General: Abdomen is flat. Bowel sounds are normal. There is no distension.     Palpations: Abdomen is soft. There is no mass.     Tenderness: There is no abdominal tenderness. There is no left CVA tenderness, guarding or rebound.     Hernia: No hernia is present.  Musculoskeletal: Normal range of motion.        General: Tenderness (mild tenderness with palpation to left gleuteal area. Has difficulty with just slight bending at waist. ) present. No swelling, deformity or signs of injury.     Right lower leg: No edema.     Left lower leg: No edema.  Skin:    General: Skin is warm and dry.     Capillary Refill: Capillary refill takes less than 2 seconds.  Neurological:     General: No focal deficit present.     Mental Status: He is alert and oriented to person, place, and time.  Psychiatric:        Mood and Affect: Mood normal.        Thought Content: Thought content normal.        Judgment: Judgment normal.       Assessment & Plan:  1. Routine general medical examination at a health care facility - Benign exam  - Follow up in one year or sooner if needed - Encouraged safe sex  - Continue to exercise and eat healthy  - Basic metabolic panel - CBC with Differential/Platelet - Hepatic function panel - TSH  2. Exercise-induced asthma  - albuterol (PROVENTIL HFA;VENTOLIN HFA) 108 (90 Base) MCG/ACT inhaler; Inhale 1-2 puffs into the lungs every 4 (four) hours as needed for wheezing. Or 30 minutes before exercise.  Dispense: 1 Inhaler; Refill: 2  3. Muscle strain  - Ambulatory referral to Physical Therapy  4. Screen for STD (sexually transmitted disease)  - Urine cytology ancillary only - HIV Antibody  (routine testing w rflx)  Shirline Frees

## 2018-05-13 LAB — HIV ANTIBODY (ROUTINE TESTING W REFLEX): HIV: NONREACTIVE

## 2018-05-13 LAB — URINE CYTOLOGY ANCILLARY ONLY
CHLAMYDIA, DNA PROBE: NEGATIVE
NEISSERIA GONORRHEA: NEGATIVE
Trichomonas: NEGATIVE

## 2018-05-18 ENCOUNTER — Other Ambulatory Visit: Payer: Self-pay

## 2018-05-18 ENCOUNTER — Ambulatory Visit: Payer: 59 | Attending: Adult Health | Admitting: Physical Therapy

## 2018-05-18 ENCOUNTER — Encounter: Payer: Self-pay | Admitting: Physical Therapy

## 2018-05-18 DIAGNOSIS — M5442 Lumbago with sciatica, left side: Secondary | ICD-10-CM | POA: Diagnosis present

## 2018-05-18 DIAGNOSIS — M25552 Pain in left hip: Secondary | ICD-10-CM | POA: Insufficient documentation

## 2018-05-18 DIAGNOSIS — G8929 Other chronic pain: Secondary | ICD-10-CM | POA: Insufficient documentation

## 2018-05-18 NOTE — Patient Instructions (Signed)
Access Code: [email or web address]  URL: [email or web address]  Date: 05/18/2018  Prepared by: Loistine Simas Beuhring   Exercises  Supine Piriformis Stretch with Leg Straight - 10 reps - 3 sets - 30 hold - 1x daily - 7x weekly  Supine Figure 4 Piriformis Stretch - 10 reps - 3 sets - 30 hold - 1x daily - 7x weekly  Patient Education  Trigger Point Dry Needling

## 2018-05-18 NOTE — Therapy (Signed)
[facility] [address], [address] [place], [place], [address] Phone: [phone]   Fax:  [phone]  Physical Therapy Evaluation  Patient Details  Name: Adam Jensen. MRN: [id number] Date of Birth: 1990-06-24 Referring Provider (PT): Shirline Frees, NP   Encounter Date: 05/18/2018  PT End of Session - 05/18/18 1704    Visit Number  1    Date for PT Re-Evaluation  07/13/18    Authorization Type  [employer]    Authorization - Number of Visits  60    PT Start Time  1445    PT Stop Time  1530    PT Time Calculation (min)  45 min    Activity Tolerance  Patient tolerated treatment well    Behavior During Therapy  [facility] for tasks assessed/performed       Past Medical History:  Diagnosis Date  . Asthma     History reviewed. No pertinent surgical history.  There were no vitals filed for this visit.   Subjective Assessment - 05/18/18 1445    Subjective  Pt reports Lt gluteal pain affecting how he walks.  Pt states it may be related to a Lt low back injury sufferred in 2017 when manual moving a car.  Pt has been treated by a chiropractor for low back which improved.  Lt gluteal pain has been present for approx five months.  Pt reports he gets occassional Lt 5th digit numbness in foot.    Limitations  Walking    How long can you walk comfortably?  can't fully extend Lt leg when walking but can walk as long as he wants to    Currently in Pain?  Yes    Pain Score  4     Pain Location  Buttocks    Pain Orientation  Left    Pain Descriptors / Indicators  Pressure    Pain Type  Chronic pain    Pain Onset  More than a month ago    Pain Frequency  Intermittent    Aggravating Factors   sleeping on Lt side, bending    Pain Relieving Factors  foam roller, hot bath    Effect of Pain on Daily Activities  can't sleep on Lt side, can't stretch Lt leg out in front         [facility] PT Assessment - 05/18/18 0001      Assessment   Medical  Diagnosis  T14.8XXA (ICD-10-CM) - Muscle strain    Referring Provider (PT)  Shirline Frees, NP    Onset Date/Surgical Date  --   approx Oct 2019   Next MD Visit  no    Prior Therapy  no, chiro for back, massage      Precautions   Precautions  None      Restrictions   Weight Bearing Restrictions  No      Balance Screen   Has the patient fallen in the past 6 months  No      Home Environment   Living Environment  Private residence    Chemical engineer;Other relatives    Type of Home  House    Home Access  Stairs to enter    Entrance Stairs-Number of Steps  8    Home Layout  Two level    Alternate Level Stairs-Number of Steps  8      Prior Function   Level of Independence  Independent    Vocation  Full time employment;Part time employment  Vocation Requirements  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, part time at [employer], sitting and standing, driving    Leisure  gym work outs, watch movies      Cognition   Overall Cognitive Status  Within Functional Limits for tasks assessed      Observation/Other Assessments   Focus on Therapeutic Outcomes (FOTO)   2%   goal 0%     ROM / Strength   AROM / PROM / Strength  AROM;Strength      AROM   Overall AROM Comments  grossly WNL with exception of trunk FB limited by 50% and Lt hip flexion limited by 50% secondary to neural tension      Strength   Overall Strength Comments  grossly 5/5 throughout bil LEs with exception of 4+/5 Lt gastroc, hamstring and gluteals (hip extension)      Flexibility   Soft Tissue Assessment /Muscle Length  yes    Hamstrings  Lt limited 50%, neural tension present      Palpation   Palpation comment  tender to palpation on Lt: glut max, med, piriformis      Ambulation/Gait   Gait Comments  slightly shorter step length on Lt secondary to signif neural tension Lt LE                Objective measurements completed on examination: See above findings.        Trigger Point Dry  Needling - 05/18/18 1720    Consent Given?  Yes    Education Handout Provided  Yes    Muscles Treated Lower Body  Piriformis;Gluteus maximus    Gluteus Maximus Response  Twitch response elicited;Palpable increased muscle length    Piriformis Response  Twitch response elicited;Palpable increased muscle length           PT Education - 05/18/18 1529    Education Details  Access Code: [email or web address]    Person(s) Educated  Patient    Methods  Explanation;Demonstration;Verbal cues;Handout    Comprehension  Verbalized understanding;Returned demonstration       PT Short Term Goals - 05/18/18 1715      PT SHORT TERM GOAL #1   Title  Pt will be I with initial HEP for Lt LE strength, neural mobilization and stretching.    Time  4    Period  Weeks    Status  New    Target Date  06/15/18      PT SHORT TERM GOAL #2   Title  Pt will report ability to sleep on Lt side with pain rating not to exceed 3/10.    Time  4    Period  Weeks    Status  New    Target Date  06/15/18      PT SHORT TERM GOAL #3   Title  Pt will report pain not to exceed 3/10 at end of work day    Time  4    Period  Weeks    Status  New    Target Date  06/15/18        PT Long Term Goals - 05/18/18 1717      PT LONG TERM GOAL #1   Title  Pt will be I in HEP and understand how to safely progress.    Time  8    Period  Weeks    Status  New    Target Date  07/13/18      PT LONG TERM GOAL #2   Title  Pt will demonstrate even stride length and symmetrical gait pattern secondary to improved neural tension in Lt LE.    Time  8    Period  Weeks    Status  New    Target Date  07/13/18      PT LONG TERM GOAL #3   Title  Pt will tolerate work day with pain rating at end of day not to exceed 2/10.    Time  8    Period  Weeks    Status  New    Target Date  07/13/18      PT LONG TERM GOAL #4   Title  Pt will report a full night's sleep with ability to sleep on Lt side at least 4 days out of the week without pain  disturbance.    Time  8    Period  Weeks    Status  New    Target Date  07/13/18      PT LONG TERM GOAL #5   Title  Pt will achieve strength rating of 5/5 throughout Lt LE to better meet the demands of his work day.    Time  8    Period  Weeks    Status  New    Target Date  07/13/18             Plan - 05/18/18 1708    Clinical Impression Statement  Pt is an active 27yo male who presented to PT with history of Lt LBP after manually moving a car approx. 2-3 years ago which has since resolved.  Pt has ongoing Lt lateral hip/buttock pain which started approx. 5 months ago with insidious onset.  Pt has subtle weakness in Lt LE in gastroc, hamstring and gluteals (hip ext), report of occasional numbness in 5th digit of Lt foot, and significant neural tension in Lt LE with positive SLR and Slump test.  PT performed dry needling to trigger points in gluteals and piriformis today.  Pt will benefit from skilled PT to address neural tension, soft tissue dysfunction, ROM and strength to maximize his tolerance of daily activities and demands.    Clinical Presentation  Stable    Clinical Decision Making  Low    Rehab Potential  Excellent    PT Frequency  2x / week    PT Duration  8 weeks    PT Treatment/Interventions  ADLs/Self Care Home Management;Cryotherapy;Electrical Stimulation;Iontophoresis 4mg /ml Dexamethasone;Moist Heat;Traction;Gait training;Functional mobility training;Therapeutic exercise;Balance training;Neuromuscular re-education;Patient/family education;Manual techniques;Taping;Spinal Manipulations;Joint Manipulations;Passive range of motion;Dry needling    PT Next Visit Plan  f/u on DN #1, f/u on hip stretches, neural mobilizations, DN #2 hamstring/lumbar, strengthening for Lt gastroc, hamstring, hip extensors    PT Home Exercise Plan  Access Code: [email or web address]    Consulted and Agree with Plan of Care  Patient       Patient will benefit from skilled therapeutic intervention in order to  improve the following deficits and impairments:  Abnormal gait, Pain, Impaired tone, Postural dysfunction, Other (comment), Decreased activity tolerance, Decreased range of motion, Decreased strength, Hypomobility, Impaired flexibility(neural tension Lt LE)  Visit Diagnosis: Pain in left hip - Plan: PT plan of care cert/re-cert  Chronic left-sided low back pain with left-sided sciatica - Plan: PT plan of care cert/re-cert     Problem List Patient Active Problem List   Diagnosis Date Noted  . 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 Exercise-induced asthma 10/01/2012   Morton Peters, PT 05/18/18 5:22 PM   [facility] [address] [place], [place], [address] Phone: [phone]   Fax:  [phone]  Name: Adam Jensen. MRN: [id number] Date of Birth: [birth year]

## 2018-05-20 ENCOUNTER — Ambulatory Visit: Payer: 59 | Admitting: Physical Therapy

## 2018-05-20 ENCOUNTER — Encounter: Payer: Self-pay | Admitting: Physical Therapy

## 2018-05-20 DIAGNOSIS — M25552 Pain in left hip: Secondary | ICD-10-CM

## 2018-05-20 DIAGNOSIS — M5442 Lumbago with sciatica, left side: Secondary | ICD-10-CM

## 2018-05-20 DIAGNOSIS — G8929 Other chronic pain: Secondary | ICD-10-CM

## 2018-05-20 NOTE — Therapy (Signed)
[facility] [address], [address] [place], [place], [address] Phone: [phone]   Fax:  [phone]  Physical Therapy Treatment  Patient Details  Name: Adam ChurchJames C Derk Jr. MRN: [id number] Date of Birth: 1991/01/19 Referring Provider (PT): Shirline FreesNafziger, Cory, NP   Encounter Date: 05/20/2018  PT End of Session - 05/20/18 1004    Visit Number  2    Date for PT Re-Evaluation  07/13/18    Authorization Type  [employer]    Authorization - Visit Number  2    Authorization - Number of Visits  60    PT Start Time  1007    PT Stop Time  1050    PT Time Calculation (min)  43 min    Activity Tolerance  Patient tolerated treatment well    Behavior During Therapy  WFL for tasks assessed/performed       Past Medical History:  Diagnosis Date  . Asthma     History reviewed. No pertinent surgical history.  There were no vitals filed for this visit.  Subjective Assessment - 05/20/18 1005    Subjective  Once I got through the soreness from the dry needling I felt better.  My range in my leg and my gait has mostly normalized.  I can take a long step forward with my Lt leg now.    Limitations  Walking    How long can you walk comfortably?  can't fully extend Lt leg when walking but can walk as long as he wants to    Currently in Pain?  Yes    Pain Score  4     Pain Location  Hip    Pain Orientation  Left;Lateral    Pain Descriptors / Indicators  Pressure    Pain Type  Chronic pain    Pain Onset  More than a month ago    Pain Frequency  Intermittent                       [facility] Adult PT Treatment/Exercise - 05/20/18 0001      Exercises   Exercises  Lumbar      Lumbar Exercises: Stretches   Single Knee to Chest Stretch  Left   with pressure release ball under gluteals/piriformis   Single Knee to Chest Stretch Limitations  20 reps with ball under Lt buttock    Piriformis Stretch  Left;30 seconds    Figure 4 Stretch  30  seconds    Figure 4 Stretch Limitations  Lt    Other Lumbar Stretch Exercise  supine sciatic neural flossing (HEP)      Lumbar Exercises: Standing   Other Standing Lumbar Exercises  functional squat technique      Lumbar Exercises: Supine   Bridge  15 reps    Bridge Limitations  added to HEP      Manual Therapy   Manual Therapy  Muscle Energy Technique;Manual Traction;Passive ROM    Passive ROM  Lt hip ROM/stretching circumduction, flexion, ER/IR    Manual Traction  prone, L5/S1    Muscle Energy Technique  correction of Lt outflare and innominate anterior rotation               PT Short Term Goals - 05/20/18 1058      PT SHORT TERM GOAL #1   Title  Pt will be I with initial HEP for Lt LE strength, neural mobilization and stretching.    Status  On-going  PT Long Term Goals - 05/18/18 1717      PT LONG TERM GOAL #1   Title  Pt will be I in HEP and understand how to safely progress.    Time  8    Period  Weeks    Status  New    Target Date  07/13/18      PT LONG TERM GOAL #2   Title  Pt will demonstrate even stride length and symmetrical gait pattern secondary to improved neural tension in Lt LE.    Time  8    Period  Weeks    Status  New    Target Date  07/13/18      PT LONG TERM GOAL #3   Title  Pt will tolerate work day with pain rating at end of day not to exceed 2/10.    Time  8    Period  Weeks    Status  New    Target Date  07/13/18      PT LONG TERM GOAL #4   Title  Pt will report a full night's sleep with ability to sleep on Lt side at least 4 days out of the week without pain disturbance.    Time  8    Period  Weeks    Status  New    Target Date  07/13/18      PT LONG TERM GOAL #5   Title  Pt will achieve strength rating of 5/5 throughout Lt LE to better meet the demands of his work day.    Time  8    Period  Weeks    Status  New    Target Date  07/13/18            Plan - 05/20/18 1015    Clinical Impression Statement  Pt  with proximal resistance to SLR which has improved by approx. 20-30 deg since initial evaluation in which dry needling was performed to piriformis and gluteals.  Pt reported less impact of pain on gait with ability to take longer stride with LLE.  Pt corrected pelvic alignment today and began some core and functional movement training.  Added self-release and neural mobs to HEP.  Pt will benefit from continued PT including dry needling, manual techniques, stretching and neural flossing to improve pain along POC.    PT Frequency  2x / week    PT Duration  8 weeks    PT Treatment/Interventions  ADLs/Self Care Home Management;Cryotherapy;Electrical Stimulation;Iontophoresis 4mg /ml Dexamethasone;Moist Heat;Traction;Gait training;Functional mobility training;Therapeutic exercise;Balance training;Neuromuscular re-education;Patient/family education;Manual techniques;Taping;Spinal Manipulations;Joint Manipulations;Passive range of motion;Dry needling    PT Next Visit Plan  DN Lt gluteals, piriformis, continue Lt neural mobs, assess pelvic alignement, progress core and hip strength    PT Home Exercise Plan  Access Code: [email or web address]    Consulted and Agree with Plan of Care  Patient       Patient will benefit from skilled therapeutic intervention in order to improve the following deficits and impairments:  Abnormal gait, Pain, Impaired tone, Postural dysfunction, Other (comment), Decreased activity tolerance, Decreased range of motion, Decreased strength, Hypomobility, Impaired flexibility  Visit Diagnosis: Pain in left hip  Chronic left-sided low back pain with left-sided sciatica     Problem List Patient Active Problem List   Diagnosis Date Noted  . 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 Exercise-induced asthma 10/01/2012    Morton Peters, PT 05/20/18 11:00 AM   [facility] [address],  [address] [place], [place], [address] Phone:  [phone]   Fax:  [phone]  Name: Adam ChurchJames C Moon Jr. MRN: [id number] Date of Birth: [birth year]

## 2018-05-25 ENCOUNTER — Telehealth: Payer: Self-pay

## 2018-05-25 NOTE — Telephone Encounter (Signed)
Copied from CRM [id number]. Topic: General - Other >> May 25, 2018  2:59 PM Laural Benes, [place] C wrote: Reason for CRM: pt called in to be advised. Pt says that he was prescribed albuterol inhaler. Pt says that when he got to his pharmacy he was advised that his Rx cost is 50.00 pt says that he is unable to afford.   ALSO, pt has a referral for PT. Pt would like to be sure of if location is in network ?  Please advise.

## 2018-05-26 ENCOUNTER — Ambulatory Visit: Payer: 59

## 2018-05-26 DIAGNOSIS — M25552 Pain in left hip: Secondary | ICD-10-CM | POA: Diagnosis not present

## 2018-05-26 DIAGNOSIS — G8929 Other chronic pain: Secondary | ICD-10-CM

## 2018-05-26 DIAGNOSIS — M5442 Lumbago with sciatica, left side: Secondary | ICD-10-CM

## 2018-05-26 NOTE — Telephone Encounter (Signed)
° °  Pt called back and will contact insurance company to see what inhaler they will cover

## 2018-05-26 NOTE — Telephone Encounter (Signed)
Left a message for a return call.  CRM created. 

## 2018-05-26 NOTE — Therapy (Signed)
[facility] [address], [address] [place], [place], [address] Phone: [phone]   Fax:  [phone]  Physical Therapy Treatment  Patient Details  Name: Adam Jensen. MRN: [id number] Date of Birth: 1990/11/16 Referring Provider (PT): Shirline Frees, NP   Encounter Date: 05/26/2018  PT End of Session - 05/26/18 1113    Visit Number  3    Date for PT Re-Evaluation  07/13/18    Authorization Type  UHC    Authorization - Visit Number  3    Authorization - Number of Visits  60    PT Start Time  1018    PT Stop Time  1059    PT Time Calculation (min)  41 min    Activity Tolerance  Patient tolerated treatment well    Behavior During Therapy  WFL for tasks assessed/performed       Past Medical History:  Diagnosis Date  . Asthma     History reviewed. No pertinent surgical history.  There were no vitals filed for this visit.  Subjective Assessment - 05/26/18 1019    Subjective  I am feeling better. My range of motion of the Lt hip is better.  I like the dry needling.      How long can you walk comfortably?  can't fully extend Lt leg when walking but can walk as long as he wants to    Currently in Pain?  Yes    Pain Score  6    with movement   Pain Location  Hip    Pain Orientation  Left;Lateral    Pain Descriptors / Indicators  Pressure    Pain Type  Chronic pain    Pain Onset  More than a month ago    Pain Frequency  Intermittent    Aggravating Factors   sleep on Lt side, bending forward    Pain Relieving Factors  hot bath, foam roller                       [facility] Adult PT Treatment/Exercise - 05/26/18 0001      Lumbar Exercises: Stretches   Active Hamstring Stretch  Left;3 reps;20 seconds    Piriformis Stretch  Left;30 seconds;3 reps    Piriformis Stretch Limitations  seated    Other Lumbar Stretch Exercise  supine sciatic neural flossing       Lumbar Exercises: Aerobic   Stationary Bike   Level 3 x 6 minutes   PT present to discuss progress     Lumbar Exercises: Sidelying   Clam  Left;20 reps      Manual Therapy   Manual Therapy  Soft tissue mobilization;Myofascial release    Manual therapy comments  tissue mobilization with [place]aday using fascia attachment to gluteals and proximal hamstrings on the Lt.         Trigger Point Dry Needling - 05/26/18 1033    Consent Given?  Yes    Muscles Treated Lower Body  Gluteus minimus;Gluteus maximus;Piriformis;Hamstring   Lt only   Gluteus Maximus Response  Twitch response elicited;Palpable increased muscle length    Gluteus Minimus Response  Twitch response elicited;Palpable increased muscle length    Piriformis Response  Twitch response elicited;Palpable increased muscle length    Hamstring Response  Palpable increased muscle length   Lt proximal/lateral hamstring            PT Short Term Goals - 05/26/18 1021      PT  SHORT TERM GOAL #1   Title  Pt will be I with initial HEP for Lt LE strength, neural mobilization and stretching.    Status  Achieved      PT SHORT TERM GOAL #2   Title  Pt will report ability to sleep on Lt side with pain rating not to exceed 3/10.    Baseline  8/10    Time  4    Period  Weeks    Status  On-going        PT Long Term Goals - 05/18/18 1717      PT LONG TERM GOAL #1   Title  Pt will be I in HEP and understand how to safely progress.    Time  8    Period  Weeks    Status  New    Target Date  07/13/18      PT LONG TERM GOAL #2   Title  Pt will demonstrate even stride length and symmetrical gait pattern secondary to improved neural tension in Lt LE.    Time  8    Period  Weeks    Status  New    Target Date  07/13/18      PT LONG TERM GOAL #3   Title  Pt will tolerate work day with pain rating at end of day not to exceed 2/10.    Time  8    Period  Weeks    Status  New    Target Date  07/13/18      PT LONG TERM GOAL #4   Title  Pt will report a full night's sleep with  ability to sleep on Lt side at least 4 days out of the week without pain disturbance.    Time  8    Period  Weeks    Status  New    Target Date  07/13/18      PT LONG TERM GOAL #5   Title  Pt will achieve strength rating of 5/5 throughout Lt LE to better meet the demands of his work day.    Time  8    Period  Weeks    Status  New    Target Date  07/13/18            Plan - 05/26/18 1029    Clinical Impression Statement  Pt continues to report Lt gluteal pain that is intermittent.  Pt reports 0/10 with sitting and up to 8/10 with sleep on the Lt side.  Pt demonstrated good mobility with Lt hip flexibility.  Pt with trigger points and tension in Lt gluteals and demonstrated improved tissue mobility after dry needling and manual therapy today.  Pt will continue to benefit from skilled PT for Lt hip flexibility, nerve glides, strength and manual/dry needling.      Rehab Potential  Excellent    PT Frequency  2x / week    PT Duration  8 weeks    PT Treatment/Interventions  ADLs/Self Care Home Management;Cryotherapy;Electrical Stimulation;Iontophoresis 4mg /ml Dexamethasone;Moist Heat;Traction;Gait training;Functional mobility training;Therapeutic exercise;Balance training;Neuromuscular re-education;Patient/family education;Manual techniques;Taping;Spinal Manipulations;Joint Manipulations;Passive range of motion;Dry needling    PT Next Visit Plan  assess response to DN Lt gluteals, piriformis, continue Lt neural mobs, add hamstring and figure 4 to HEP, hip strength    PT Home Exercise Plan  Access Code: [email or web address]    Recommended Other Services  initial cert is signed    Consulted and Agree with Plan of Care  Patient  Patient will benefit from skilled therapeutic intervention in order to improve the following deficits and impairments:  Abnormal gait, Pain, Impaired tone, Postural dysfunction, Other (comment), Decreased activity tolerance, Decreased range of motion, Decreased strength,  Hypomobility, Impaired flexibility  Visit Diagnosis: Pain in left hip  Chronic left-sided low back pain with left-sided sciatica     Problem List Patient Active Problem List   Diagnosis Date Noted  . 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 Exercise-induced asthma 10/01/2012    Lorrene Reid, PT 05/26/18 11:17 AM  [facility] [address], [address] [place], [place], [address] Phone: [phone]   Fax:  [phone]  Name: Adam Jensen. MRN: [id number] Date of Birth: [birth year]

## 2018-05-26 NOTE — Telephone Encounter (Signed)
Left a message for a return call.  Pt needs to be notified to call his insurance company to see if what albuterol inhaler is covered.  He will also need to call his insurance to see if PT is in network.  [employer] agent may inform pt of all.

## 2018-05-29 ENCOUNTER — Ambulatory Visit: Payer: 59 | Admitting: Physical Therapy

## 2018-06-02 ENCOUNTER — Ambulatory Visit: Payer: 59

## 2018-06-02 DIAGNOSIS — M5442 Lumbago with sciatica, left side: Secondary | ICD-10-CM

## 2018-06-02 DIAGNOSIS — M25552 Pain in left hip: Secondary | ICD-10-CM | POA: Diagnosis not present

## 2018-06-02 DIAGNOSIS — G8929 Other chronic pain: Secondary | ICD-10-CM

## 2018-06-02 NOTE — Therapy (Signed)
[facility] [address], [address] [place], [place], [address] Phone: [phone]   Fax:  [phone]  Physical Therapy Treatment  Patient Details  Name: Adam Jensen. MRN: [id number] Date of Birth: [birth year] Referring Provider (PT): Shirline Frees, NP   Encounter Date: 06/02/2018  PT End of Session - 06/02/18 1145    Visit Number  4    Date for PT Re-Evaluation  07/13/18    Authorization - Visit Number  4    Authorization - Number of Visits  60    PT Start Time  1108   late and dry needling   PT Stop Time  1153    PT Time Calculation (min)  45 min    Activity Tolerance  Patient tolerated treatment well    Behavior During Therapy  [facility] for tasks assessed/performed       Past Medical History:  Diagnosis Date  . Asthma     History reviewed. No pertinent surgical history.  There were no vitals filed for this visit.  Subjective Assessment - 06/02/18 1112    Subjective  I am feeling good since I took a shower this morning.      Currently in Pain?  Yes    Pain Score  5    up to 8/10 yesterday.     Pain Location  Hip    Pain Orientation  Left;Lateral    Pain Descriptors / Indicators  Sore    Pain Type  Chronic pain    Pain Onset  More than a month ago    Pain Frequency  Intermittent    Aggravating Factors   overstretching, sleep/supine position    Pain Relieving Factors  hot water, foam roller                       [facility] Adult PT Treatment/Exercise - 06/02/18 0001      Lumbar Exercises: Stretches   Active Hamstring Stretch  Left;3 reps;20 seconds    Active Hamstring Stretch Limitations  supine using strap    Piriformis Stretch  Left;30 seconds;3 reps    Piriformis Stretch Limitations  supine diagonal knee to chest      Lumbar Exercises: Aerobic   Stationary Bike  Level 3 x 6 minutes   PT present to discuss progress     Modalities   Modalities  Moist Heat      Moist Heat Therapy   Number Minutes Moist Heat  10 Minutes    Moist Heat Location  Lumbar Spine      Manual Therapy   Manual Therapy  Soft tissue mobilization;Myofascial release    Manual therapy comments  soft tissue mobilization to Lt lumbar paraspinals and gluteals       Trigger Point Dry Needling - 06/02/18 1122    Consent Given?  Yes    Muscles Treated Lower Body  Gluteus minimus;Gluteus maximus   Lt only, Lt lumbar multifidi   Gluteus Minimus Response  Twitch response elicited;Palpable increased muscle length    Piriformis Response  Twitch response elicited;Palpable increased muscle length    Hamstring Response  --             PT Short Term Goals - 05/26/18 1021      PT SHORT TERM GOAL #1   Title  Pt will be I with initial HEP for Lt LE strength, neural mobilization and stretching.    Status  Achieved      PT SHORT  TERM GOAL #2   Title  Pt will report ability to sleep on Lt side with pain rating not to exceed 3/10.    Baseline  8/10    Time  4    Period  Weeks    Status  On-going        PT Long Term Goals - 05/18/18 1717      PT LONG TERM GOAL #1   Title  Pt will be I in HEP and understand how to safely progress.    Time  8    Period  Weeks    Status  New    Target Date  07/13/18      PT LONG TERM GOAL #2   Title  Pt will demonstrate even stride length and symmetrical gait pattern secondary to improved neural tension in Lt LE.    Time  8    Period  Weeks    Status  New    Target Date  07/13/18      PT LONG TERM GOAL #3   Title  Pt will tolerate work day with pain rating at end of day not to exceed 2/10.    Time  8    Period  Weeks    Status  New    Target Date  07/13/18      PT LONG TERM GOAL #4   Title  Pt will report a full night's sleep with ability to sleep on Lt side at least 4 days out of the week without pain disturbance.    Time  8    Period  Weeks    Status  New    Target Date  07/13/18      PT LONG TERM GOAL #5   Title  Pt will achieve strength  rating of 5/5 throughout Lt LE to better meet the demands of his work day.    Time  8    Period  Weeks    Status  New    Target Date  07/13/18            Plan - 06/02/18 1120    Clinical Impression Statement  Pt reports intermittent Lt hamstring/hip pain and reports 60% overall improvement since the start of care.  Pt continues to stretch and work on strength at home.  Pt with tension and trigger points in Lt proximal/lateral hamstring and gluteals.  Pt demonstrated improved tissue mobility after dry needling today.  Pt with significant limitations in hamstring and hip IR/ER flexibility.  Pt will continue to benefit from skilled PT for hip flexibility, manual and strength.      Rehab Potential  Excellent    PT Frequency  2x / week    PT Duration  8 weeks    PT Treatment/Interventions  ADLs/Self Care Home Management;Cryotherapy;Electrical Stimulation;Iontophoresis 4mg /ml Dexamethasone;Moist Heat;Traction;Gait training;Functional mobility training;Therapeutic exercise;Balance training;Neuromuscular re-education;Patient/family education;Manual techniques;Taping;Spinal Manipulations;Joint Manipulations;Passive range of motion;Dry needling    PT Next Visit Plan  assess response to DN Lt gluteals, piriformis, continue Lt neural mobs, add hamstring and figure 4 to HEP, hip strength    PT Home Exercise Plan  Access Code: [email or web address]    Consulted and Agree with Plan of Care  Patient       Patient will benefit from skilled therapeutic intervention in order to improve the following deficits and impairments:  Abnormal gait, Pain, Impaired tone, Postural dysfunction, Other (comment), Decreased activity tolerance, Decreased range of motion, Decreased strength, Hypomobility, Impaired flexibility  Visit Diagnosis: Pain in left  hip  Chronic left-sided low back pain with left-sided sciatica     Problem List Patient Active Problem List   Diagnosis Date Noted  . 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  Exercise-induced asthma 10/01/2012    Lorrene Reid, PT 06/02/18 11:47 AM  [facility] [address], [address] [place], [place], [address] Phone: [phone]   Fax:  [phone]  Name: Adam Jensen. MRN: [id number] Date of Birth: 10-Jul-1990

## 2018-06-04 ENCOUNTER — Ambulatory Visit: Payer: 59

## 2018-06-04 DIAGNOSIS — G8929 Other chronic pain: Secondary | ICD-10-CM

## 2018-06-04 DIAGNOSIS — M25552 Pain in left hip: Secondary | ICD-10-CM | POA: Diagnosis not present

## 2018-06-04 DIAGNOSIS — M5442 Lumbago with sciatica, left side: Secondary | ICD-10-CM

## 2018-06-04 NOTE — Patient Instructions (Signed)
Access Code: [email or web address]  URL: [email or web address]  Date: 06/04/2018  Prepared by: Lorrene Reid   Exercises  Supine Figure 4 Piriformis Stretch - 3 reps - 1 sets - 20 hold - 2x daily - 7x weekly Sidelying Open Book Thoracic Rotation with Knee on Foam Roll - 10 reps - 1 sets - 2x daily - 7x weekly Clamshell - 10 reps - 2 sets - 2x daily - 7x weekly Seated Hamstring Stretch - 3 reps - 1 sets - 20 hold - 2x daily - 7x weekly

## 2018-06-04 NOTE — Therapy (Signed)
[facility] [address], [address] [place], [place], [address] Phone: [phone]   Fax:  [phone]  Physical Therapy Treatment  Patient Details  Name: Adam Jensen. MRN: [id number] Date of Birth: 01/28/1991 Referring Provider (PT): Shirline Frees, NP   Encounter Date: 06/04/2018  PT End of Session - 06/04/18 1143    Visit Number  5    Date for PT Re-Evaluation  07/13/18    Authorization Type  UHC    PT Start Time  1103    PT Stop Time  1142    PT Time Calculation (min)  39 min    Activity Tolerance  Patient tolerated treatment well    Behavior During Therapy  [facility] for tasks assessed/performed       Past Medical History:  Diagnosis Date  . Asthma     History reviewed. No pertinent surgical history.  There were no vitals filed for this visit.  Subjective Assessment - 06/04/18 1106    Subjective  I am feeling better after dry needling.  I can take a longer step.        Currently in Pain?  Yes    Pain Score  2     Pain Location  Hip    Pain Orientation  Left;Lateral                       [facility] Adult PT Treatment/Exercise - 06/04/18 0001      Lumbar Exercises: Stretches   Active Hamstring Stretch  Left;3 reps;20 seconds    Active Hamstring Stretch Limitations  supine using strap and seated     Piriformis Stretch  Left;30 seconds;3 reps    Piriformis Stretch Limitations  supine figure 4      Lumbar Exercises: Aerobic   Stationary Bike  Level 3 x 6 minutes   PT present to discuss progress     Lumbar Exercises: Supine   Bridge  20 reps;5 seconds      Lumbar Exercises: Sidelying   Clam  Left;20 reps    Other Sidelying Lumbar Exercises  open book stretch x 10 each             PT Education - 06/04/18 1128    Education Details  Access Code: [email or web address]    Person(s) Educated  Patient    Methods  Explanation;Demonstration;Handout    Comprehension  Verbalized understanding;Returned  demonstration       PT Short Term Goals - 05/26/18 1021      PT SHORT TERM GOAL #1   Title  Pt will be I with initial HEP for Lt LE strength, neural mobilization and stretching.    Status  Achieved      PT SHORT TERM GOAL #2   Title  Pt will report ability to sleep on Lt side with pain rating not to exceed 3/10.    Baseline  8/10    Time  4    Period  Weeks    Status  On-going        PT Long Term Goals - 05/18/18 1717      PT LONG TERM GOAL #1   Title  Pt will be I in HEP and understand how to safely progress.    Time  8    Period  Weeks    Status  New    Target Date  07/13/18      PT LONG TERM GOAL #2   Title  Pt will  demonstrate even stride length and symmetrical gait pattern secondary to improved neural tension in Lt LE.    Time  8    Period  Weeks    Status  New    Target Date  07/13/18      PT LONG TERM GOAL #3   Title  Pt will tolerate work day with pain rating at end of day not to exceed 2/10.    Time  8    Period  Weeks    Status  New    Target Date  07/13/18      PT LONG TERM GOAL #4   Title  Pt will report a full night's sleep with ability to sleep on Lt side at least 4 days out of the week without pain disturbance.    Time  8    Period  Weeks    Status  New    Target Date  07/13/18      PT LONG TERM GOAL #5   Title  Pt will achieve strength rating of 5/5 throughout Lt LE to better meet the demands of his work day.    Time  8    Period  Weeks    Status  New    Target Date  07/13/18            Plan - 06/04/18 1122    Clinical Impression Statement  Pt reports intermittent Lt hamstring/hip pain and reports 60% overall improvement since the start of care.  Pt with good relief of symptoms after dry needling last session.  He reports improved mobility of the Lt hip and increased ability to take a longer step.   Pt with reduced Lt hip strength and flexibility and PT added to HEP today to address these deficits.  Pt will continue to benefit from  skilled PT for strength, flexibility and manual to address tissue mobility.      Rehab Potential  Excellent    PT Frequency  2x / week    PT Duration  8 weeks    PT Treatment/Interventions  ADLs/Self Care Home Management;Cryotherapy;Electrical Stimulation;Iontophoresis 4mg /ml Dexamethasone;Moist Heat;Traction;Gait training;Functional mobility training;Therapeutic exercise;Balance training;Neuromuscular re-education;Patient/family education;Manual techniques;Taping;Spinal Manipulations;Joint Manipulations;Passive range of motion;Dry needling    PT Next Visit Plan  DN to Lt gluteals and lumbar spine, review new HEP, hip strength    PT Home Exercise Plan  Access Code: [email or web address]    Consulted and Agree with Plan of Care  Patient       Patient will benefit from skilled therapeutic intervention in order to improve the following deficits and impairments:  Abnormal gait, Pain, Impaired tone, Postural dysfunction, Other (comment), Decreased activity tolerance, Decreased range of motion, Decreased strength, Hypomobility, Impaired flexibility  Visit Diagnosis: Pain in left hip  Chronic left-sided low back pain with left-sided sciatica     Problem List Patient Active Problem List   Diagnosis Date Noted  . 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 Exercise-induced asthma 10/01/2012    Lorrene Reid, PT 06/04/18 11:50 AM  [facility] [address] [place], [place], [address] Phone: [phone]   Fax:  [phone]  Name: Adam Jensen. MRN: [id number] Date of Birth: [birth year]

## 2018-06-10 ENCOUNTER — Telehealth: Payer: Self-pay

## 2018-06-10 ENCOUNTER — Ambulatory Visit: Payer: 59 | Attending: Adult Health

## 2018-06-10 DIAGNOSIS — G8929 Other chronic pain: Secondary | ICD-10-CM | POA: Insufficient documentation

## 2018-06-10 DIAGNOSIS — M25552 Pain in left hip: Secondary | ICD-10-CM | POA: Insufficient documentation

## 2018-06-10 DIAGNOSIS — M5442 Lumbago with sciatica, left side: Secondary | ICD-10-CM | POA: Insufficient documentation

## 2018-06-10 NOTE — Telephone Encounter (Signed)
PT called pt due to no-show appointment.  Left message on voicemail.

## 2018-06-17 ENCOUNTER — Encounter: Payer: Self-pay | Admitting: Physical Therapy

## 2018-06-17 ENCOUNTER — Ambulatory Visit: Payer: 59 | Admitting: Physical Therapy

## 2018-06-17 ENCOUNTER — Other Ambulatory Visit: Payer: Self-pay

## 2018-06-17 DIAGNOSIS — M5442 Lumbago with sciatica, left side: Secondary | ICD-10-CM | POA: Diagnosis present

## 2018-06-17 DIAGNOSIS — G8929 Other chronic pain: Secondary | ICD-10-CM | POA: Diagnosis present

## 2018-06-17 DIAGNOSIS — M25552 Pain in left hip: Secondary | ICD-10-CM

## 2018-06-17 NOTE — Therapy (Addendum)
[facility] [address] [place], [place], [address] Phone: [phone]   Fax:  [phone]  Physical Therapy Treatment  Patient Details  Name: Adam Jensen. MRN: [id number] Date of Birth: 01/19/1991 Referring Provider (PT): Dorothyann Peng, NP   Encounter Date: 06/17/2018  PT End of Session - 06/17/18 1153    Visit Number  6    Date for PT Re-Evaluation  07/13/18    Authorization Type  UHC    Authorization - Visit Number  5    Authorization - Number of Visits  60    PT Start Time  6962    PT Stop Time  1230    PT Time Calculation (min)  43 min    Activity Tolerance  Patient tolerated treatment well    Behavior During Therapy  WFL for tasks assessed/performed       Past Medical History:  Diagnosis Date  . Asthma     History reviewed. No pertinent surgical history.  There were no vitals filed for this visit.  Subjective Assessment - 06/17/18 1150    Subjective  I did legs at gym yesterday and am sore from that, but can tell I've held onto the range gains from PT.  I just feel really tight.  The dry needling really helps.    How long can you walk comfortably?  no limit now since starting PT    Currently in Pain?  Yes    Pain Score  6     Pain Location  Hip    Pain Orientation  Left;Lateral    Pain Descriptors / Indicators  Sore    Pain Type  Chronic pain    Pain Onset  More than a month ago    Aggravating Factors   being still, laying on Lt    Pain Relieving Factors  exercise, being active    Effect of Pain on Daily Activities  can't sleep on Lt side                       [facility] Adult PT Treatment/Exercise - 06/17/18 0001      Exercises   Exercises  Lumbar      Lumbar Exercises: Stretches   Other Lumbar Stretch Exercise  standing T x 10 reps, Lt LE     Other Lumbar Stretch Exercise  hip flexor stretch on 2nd step 15 reps, 2 sec      Lumbar Exercises: Standing   Other Standing  Lumbar Exercises  hip flexion Lt LE blue tband 10 reps standing       Trigger Point Dry Needling - 06/17/18 0001    Consent Given?  Yes    Muscles Treated Back/Hip  Gluteus maximus;Piriformis    Gluteus Maximus Response  Twitch response elicited;Palpable increased muscle length    Piriformis Response  Twitch response elicited;Palpable increased muscle length             PT Short Term Goals - 05/26/18 1021      PT SHORT TERM GOAL #1   Title  Pt will be I with initial HEP for Lt LE strength, neural mobilization and stretching.    Status  Achieved      PT SHORT TERM GOAL #2   Title  Pt will report ability to sleep on Lt side with pain rating not to exceed 3/10.    Baseline  8/10    Time  4    Period  Weeks    Status  On-going        PT Long Term Goals - 05/18/18 1717      PT LONG TERM GOAL #1   Title  Pt will be I in HEP and understand how to safely progress.    Time  8    Period  Weeks    Status  New    Target Date  07/13/18      PT LONG TERM GOAL #2   Title  Pt will demonstrate even stride length and symmetrical gait pattern secondary to improved neural tension in Lt LE.    Time  8    Period  Weeks    Status  New    Target Date  07/13/18      PT LONG TERM GOAL #3   Title  Pt will tolerate work day with pain rating at end of day not to exceed 2/10.    Time  8    Period  Weeks    Status  New    Target Date  07/13/18      PT LONG TERM GOAL #4   Title  Pt will report a full night's sleep with ability to sleep on Lt side at least 4 days out of the week without pain disturbance.    Time  8    Period  Weeks    Status  New    Target Date  07/13/18      PT LONG TERM GOAL #5   Title  Pt will achieve strength rating of 5/5 throughout Lt LE to better meet the demands of his work day.    Time  8    Period  Weeks    Status  New    Target Date  07/13/18            Plan - 06/17/18 1342    Clinical Impression Statement  Pt maintaining progress between  visits.  PT found Lt SI joint asymmetry with upslip and outflare present today which corrected with prone traction and muscle energy.  Pt quick to fatigue with Lt hip flexor strengthening today.  Good release of Lt superior aspet of glut max with dry needling today.  Pt will continue to benefit from skilled PT along current POC.    PT Frequency  2x / week    PT Duration  8 weeks    PT Treatment/Interventions  ADLs/Self Care Home Management;Cryotherapy;Electrical Stimulation;Iontophoresis 20m/ml Dexamethasone;Moist Heat;Traction;Gait training;Functional mobility training;Therapeutic exercise;Balance training;Neuromuscular re-education;Patient/family education;Manual techniques;Taping;Spinal Manipulations;Joint Manipulations;Passive range of motion;Dry needling    PT Next Visit Plan  DN to Lt gluteals and lumbar spine, hip strength, check SI alignment, neural mobs    PT Home Exercise Plan  Access Code: [email or web address]    Consulted and Agree with Plan of Care  Patient       Patient will benefit from skilled therapeutic intervention in order to improve the following deficits and impairments:  Abnormal gait, Pain, Impaired tone, Postural dysfunction, Other (comment), Decreased activity tolerance, Decreased range of motion, Decreased strength, Hypomobility, Impaired flexibility  Visit Diagnosis: Pain in left hip  Chronic left-sided low back pain with left-sided sciatica     Problem List Patient Active Problem List   Diagnosis Date Noted  . 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 Exercise-induced asthma 10/01/2012   JBaruch Merl PT 06/17/18 1:45 PM PHYSICAL THERAPY DISCHARGE SUMMARY  Visits from Start of Care: 6  Current functional level related to goals / functional outcomes: See above for  current PT status.  Pt had 2 no-show appointments and additional appointments were canceled due to clinic closure associated with COVID19.  Pt was not able to make contact with pt during this time.      Remaining deficits: See above for most current PT status.     Education / Equipment: HEP, posture/body mechanics Plan: Patient agrees to discharge.  Patient goals were not met. Patient is being discharged due to not returning since the last visit.  ?????        Sigurd Sos, PT 07/28/18 6:40 PM   [facility] [address] [place], [place], [address] Phone: [phone]   Fax:  [phone]  Name: Louise Victory. MRN: [id number] Date of Birth: May 27, 1990

## 2018-06-22 ENCOUNTER — Ambulatory Visit: Payer: 59

## 2018-06-22 ENCOUNTER — Telehealth: Payer: Self-pay

## 2018-06-22 NOTE — Telephone Encounter (Signed)
PT called pt due to no-show appointment today.  PT left message on pt voicemail.

## 2020-04-19 ENCOUNTER — Encounter: Payer: 59 | Admitting: Adult Health

## 2020-04-21 ENCOUNTER — Encounter: Payer: Self-pay | Admitting: Adult Health

## 2020-04-21 ENCOUNTER — Ambulatory Visit (INDEPENDENT_AMBULATORY_CARE_PROVIDER_SITE_OTHER): Payer: No Typology Code available for payment source | Admitting: Adult Health

## 2020-04-21 ENCOUNTER — Other Ambulatory Visit: Payer: Self-pay

## 2020-04-21 VITALS — BP 110/72 | Temp 98.4°F | Ht 70.75 in | Wt 197.0 lb

## 2020-04-21 DIAGNOSIS — J45990 Exercise induced bronchospasm: Secondary | ICD-10-CM | POA: Diagnosis not present

## 2020-04-21 DIAGNOSIS — Z0000 Encounter for general adult medical examination without abnormal findings: Secondary | ICD-10-CM

## 2020-04-21 DIAGNOSIS — Z0001 Encounter for general adult medical examination with abnormal findings: Secondary | ICD-10-CM

## 2020-04-21 DIAGNOSIS — M25552 Pain in left hip: Secondary | ICD-10-CM

## 2020-04-21 LAB — CBC WITH DIFFERENTIAL/PLATELET
Basophils Absolute: 0 10*3/uL (ref 0.0–0.1)
Basophils Relative: 0.4 % (ref 0.0–3.0)
Eosinophils Absolute: 0.1 10*3/uL (ref 0.0–0.7)
Eosinophils Relative: 1.8 % (ref 0.0–5.0)
HCT: 42.8 % (ref 39.0–52.0)
Hemoglobin: 13.9 g/dL (ref 13.0–17.0)
Lymphocytes Relative: 34.5 % (ref 12.0–46.0)
Lymphs Abs: 2.7 10*3/uL (ref 0.7–4.0)
MCHC: 32.5 g/dL (ref 30.0–36.0)
MCV: 71.2 fl — ABNORMAL LOW (ref 78.0–100.0)
Monocytes Absolute: 0.7 10*3/uL (ref 0.1–1.0)
Monocytes Relative: 9.3 % (ref 3.0–12.0)
Neutro Abs: 4.2 10*3/uL (ref 1.4–7.7)
Neutrophils Relative %: 54 % (ref 43.0–77.0)
Platelets: 202 10*3/uL (ref 150.0–400.0)
RBC: 6.01 Mil/uL — ABNORMAL HIGH (ref 4.22–5.81)
RDW: 14.7 % (ref 11.5–15.5)
WBC: 7.8 10*3/uL (ref 4.0–10.5)

## 2020-04-21 LAB — COMPREHENSIVE METABOLIC PANEL
ALT: 34 U/L (ref 0–53)
AST: 24 U/L (ref 0–37)
Albumin: 4.8 g/dL (ref 3.5–5.2)
Alkaline Phosphatase: 66 U/L (ref 39–117)
BUN: 16 mg/dL (ref 6–23)
CO2: 29 mEq/L (ref 19–32)
Calcium: 10.1 mg/dL (ref 8.4–10.5)
Chloride: 103 mEq/L (ref 96–112)
Creatinine, Ser: 1.17 mg/dL (ref 0.40–1.50)
GFR: 85.5 mL/min (ref >60.00)
Glucose, Bld: 65 mg/dL — ABNORMAL LOW (ref 70–99)
Potassium: 4.5 mEq/L (ref 3.5–5.1)
Sodium: 138 mEq/L (ref 135–145)
Total Bilirubin: 0.6 mg/dL (ref 0.2–1.2)
Total Protein: 7 g/dL (ref 6.0–8.3)

## 2020-04-21 LAB — TSH: TSH: 1.54 u[IU]/mL (ref 0.35–4.50)

## 2020-04-21 LAB — LIPID PANEL
Cholesterol: 209 mg/dL — ABNORMAL HIGH (ref 0–200)
HDL: 42.7 mg/dL (ref >39.00)
NonHDL: 166.22
Total CHOL/HDL Ratio: 5
Triglycerides: 255 mg/dL — ABNORMAL HIGH (ref 0.0–149.0)
VLDL: 51 mg/dL — ABNORMAL HIGH (ref 0.0–40.0)

## 2020-04-21 LAB — LDL CHOLESTEROL, DIRECT: Direct LDL: 141 mg/dL

## 2020-04-21 MED ORDER — CYCLOBENZAPRINE HCL 10 MG PO TABS: 10.0000 mg | ORAL_TABLET | Freq: Three times a day (TID) | ORAL | 0 refills | Status: DC | PRN

## 2020-04-21 MED ORDER — ALBUTEROL SULFATE HFA 108 (90 BASE) MCG/ACT IN AERS: 1.0000 | INHALATION_SPRAY | RESPIRATORY_TRACT | 2 refills | Status: DC | PRN

## 2020-04-21 MED ORDER — METHYLPREDNISOLONE 4 MG PO TBPK: ORAL_TABLET | ORAL | 0 refills | Status: DC

## 2020-04-21 NOTE — Progress Notes (Signed)
Subjective:    Patient ID: Adam Jensen., male    DOB: 22-May-1990, 30 y.o.   MRN: [id number]  HPI Patient presents for yearly preventative medicine examination. He is a pleasant 30 year old male who  has a past medical history of Asthma.  H/o Exercise induced asthma -uses albuterol inhaler as needed.  Does not use frequently.  Acute issues  -Reports that over the last probably couple of months he has been experiencing some mild leftt-sided hip pain.  Pain is worse with certain movements such as bending or twisting or doing exercises.  Denies any trauma or aggravating injury but does work out quite often.  Has not noticed any pain that radiates down the outside of his left leg. Pain is intermittnet   All immunizations and health maintenance protocols were reviewed with the patient and needed orders were placed. Due for tetanus booster. He refuses covid vaccinations.   Appropriate screening laboratory values were ordered for the patient including screening of hyperlipidemia, renal function and hepatic function.   Medication reconciliation,  past medical history, social history, problem list and allergies were reviewed in detail with the patient  Goals were established with regard to weight loss, exercise, and  diet in compliance with medications Wt Readings from Last 3 Encounters:  04/21/20 197 lb (89.4 kg)  05/12/18 172 lb (78 kg)  04/03/17 164 lb (74.4 kg)     Review of Systems  Constitutional: Negative.   HENT: Negative.   Eyes: Negative.   Respiratory: Negative.   Cardiovascular: Negative.   Gastrointestinal: Negative.   Endocrine: Negative.   Genitourinary: Negative.   Musculoskeletal: Positive for myalgias.  Skin: Negative.   Allergic/Immunologic: Negative.   Neurological: Negative.   Hematological: Negative.   Psychiatric/Behavioral: Negative.   All other systems reviewed and are negative.  Past Medical History:  Diagnosis Date  . Asthma     Social History    Socioeconomic History  . Marital status: Single    Spouse name: Not on file  . Number of children: Not on file  . Years of education: Not on file  . Highest education level: Not on file  Occupational History  . Not on file  Tobacco Use  . Smoking status: Never Smoker  . Smokeless tobacco: Never Used  Substance and Sexual Activity  . Alcohol use: No  . Drug use: No  . Sexual activity: Not on file  Other Topics Concern  . Not on file  Social History Narrative  . Not on file   Social Determinants of Health   Financial Resource Strain: Not on file  Food Insecurity: Not on file  Transportation Needs: Not on file  Physical Activity: Not on file  Stress: Not on file  Social Connections: Not on file  Intimate Partner Violence: Not on file    History reviewed. No pertinent surgical history.  History reviewed. No pertinent family history.  No Known Allergies  Current Outpatient Medications on File Prior to Visit  Medication Sig Dispense Refill  . albuterol (PROVENTIL HFA;VENTOLIN HFA) 108 (90 Base) MCG/ACT inhaler Inhale 1-2 puffs into the lungs every 4 (four) hours as needed for wheezing. Or 30 minutes before exercise. 1 Inhaler 2   No current facility-administered medications on file prior to visit.    BP 110/72   Temp 98.4 F (36.9 C)   Ht 5' 10.75" (1.797 m) Comment: WITH SHOES  Wt 197 lb (89.4 kg)   BMI 27.67 kg/m  Objective:   Physical Exam Vitals and nursing note reviewed.  Constitutional:      General: He is not in acute distress.    Appearance: Normal appearance. He is well-developed and normal weight.  HENT:     Head: Normocephalic and atraumatic.     Right Ear: Tympanic membrane, ear canal and external ear normal. There is no impacted cerumen.     Left Ear: Tympanic membrane, ear canal and external ear normal. There is no impacted cerumen.     Nose: Nose normal. No congestion or rhinorrhea.     Mouth/Throat:     Mouth: Mucous membranes are  moist.     Pharynx: Oropharynx is clear. No oropharyngeal exudate or posterior oropharyngeal erythema.  Eyes:     General:        Right eye: No discharge.        Left eye: No discharge.     Extraocular Movements: Extraocular movements intact.     Conjunctiva/sclera: Conjunctivae normal.     Pupils: Pupils are equal, round, and reactive to light.  Neck:     Vascular: No carotid bruit.     Trachea: No tracheal deviation.  Cardiovascular:     Rate and Rhythm: Normal rate and regular rhythm.     Pulses: Normal pulses.     Heart sounds: Normal heart sounds. No murmur heard. No friction rub. No gallop.   Pulmonary:     Effort: Pulmonary effort is normal. No respiratory distress.     Breath sounds: Normal breath sounds. No stridor. No wheezing, rhonchi or rales.  Chest:     Chest wall: No tenderness.  Abdominal:     General: Bowel sounds are normal. There is no distension.     Palpations: Abdomen is soft. There is no mass.     Tenderness: There is no abdominal tenderness. There is no right CVA tenderness, left CVA tenderness, guarding or rebound.     Hernia: No hernia is present.  Musculoskeletal:        General: No swelling, deformity or signs of injury. Normal range of motion.     Right hip: Normal.     Left hip: Tenderness (tendeness with deep palpation below left hip ) and bony tenderness present. No crepitus. Normal range of motion. Normal strength.     Right lower leg: No edema.     Left lower leg: No edema.  Lymphadenopathy:     Cervical: No cervical adenopathy.  Skin:    General: Skin is warm and dry.     Capillary Refill: Capillary refill takes less than 2 seconds.     Coloration: Skin is not jaundiced or pale.     Findings: No bruising, erythema, lesion or rash.  Neurological:     General: No focal deficit present.     Mental Status: He is alert and oriented to person, place, and time.     Cranial Nerves: No cranial nerve deficit.     Sensory: No sensory deficit.      Motor: No weakness.     Coordination: Coordination normal.     Gait: Gait normal.     Deep Tendon Reflexes: Reflexes normal.  Psychiatric:        Mood and Affect: Mood normal.        Behavior: Behavior normal.        Thought Content: Thought content normal.        Judgment: Judgment normal.       Assessment & Plan:  1.  Routine general medical examination at a health care facility  - CBC with Differential/Platelet; Future - Comprehensive metabolic panel; Future - Lipid panel; Future - TSH; Future - TSH - Lipid panel - Comprehensive metabolic panel - CBC with Differential/Platelet  2. Exercise-induced asthma  - albuterol (VENTOLIN HFA) 108 (90 Base) MCG/ACT inhaler; Inhale 1-2 puffs into the lungs every 4 (four) hours as needed for wheezing. Or 30 minutes before exercise.  Dispense: 1 each; Refill: 2  3. Left hip pain - methylPREDNISolone (MEDROL DOSEPAK) 4 MG TBPK tablet; Take as directed  Dispense: 21 tablet; Refill: 0 - cyclobenzaprine (FLEXERIL) 10 MG tablet; Take 1 tablet (10 mg total) by mouth 3 (three) times daily as needed for muscle spasms.  Dispense: 30 tablet; Refill: 0   Shirline Frees, NP

## 2020-04-21 NOTE — Patient Instructions (Signed)
It was great seeing you today   I will follow up with you regarding your blood work   I have sent in some prednisone and a muscle relaxer to help with the left hip. Let me know if this does not help   If everything goes well, I will see you back in one year

## 2020-08-15 ENCOUNTER — Other Ambulatory Visit: Payer: Self-pay

## 2020-08-15 ENCOUNTER — Ambulatory Visit (INDEPENDENT_AMBULATORY_CARE_PROVIDER_SITE_OTHER): Payer: No Typology Code available for payment source

## 2020-08-15 ENCOUNTER — Ambulatory Visit: Payer: No Typology Code available for payment source | Admitting: Adult Health

## 2020-08-15 ENCOUNTER — Other Ambulatory Visit (HOSPITAL_COMMUNITY)
Admission: RE | Admit: 2020-08-15 | Discharge: 2020-08-15 | Disposition: A | Discharge status: Home / Self Care | Payer: No Typology Code available for payment source | Source: Ambulatory Visit | Attending: Adult Health | Admitting: Adult Health

## 2020-08-15 VITALS — BP 118/76 | HR 83 | Temp 98.5°F | Ht 70.5 in | Wt 213.6 lb

## 2020-08-15 DIAGNOSIS — G8929 Other chronic pain: Secondary | ICD-10-CM

## 2020-08-15 DIAGNOSIS — M5442 Lumbago with sciatica, left side: Secondary | ICD-10-CM

## 2020-08-15 DIAGNOSIS — R350 Frequency of micturition: Secondary | ICD-10-CM | POA: Diagnosis present

## 2020-08-15 DIAGNOSIS — N50819 Testicular pain, unspecified: Secondary | ICD-10-CM | POA: Diagnosis not present

## 2020-08-15 LAB — URINALYSIS
Bilirubin Urine: NEGATIVE
Hgb urine dipstick: NEGATIVE
Ketones, ur: NEGATIVE
Leukocytes,Ua: NEGATIVE
Nitrite: NEGATIVE
Specific Gravity, Urine: 1.01 (ref 1.000–1.030)
Total Protein, Urine: NEGATIVE
Urine Glucose: NEGATIVE
Urobilinogen, UA: 0.2 (ref 0.0–1.0)
pH: 7 (ref 5.0–8.0)

## 2020-08-15 NOTE — Progress Notes (Signed)
Subjective:    Patient ID: Adam Jensen., male    DOB: 07/19/1990, 30 y.o.   MRN: [id number]  HPI  30 year old male who  has a past medical history of Asthma.  He presents to the office today for multiple issues.   1.  Over the last few weeks he has been experiencing urinary frequency throughout the day as well as tingling in his right testicle.  Denies dysuria, discharge from urethra, scrotal swelling, feeling of a mass or lump on his testicle.  Does report having unprotected sex a few weeks ago, was tested at health department in [place] for STDs and this came back negative.  Reports that today the tingling in his right testicle has resolved  2.  Continues to have mild left-sided hip pain that radiates down the outside of his left leg.  Pain is worse with certain movements such as bending or twisting.  Denies any trauma or aggravating injury.  Was last seen for this in January 2022 and was treated with a Medrol Dosepak and Flexeril which did not help with his symptoms.    Review of Systems See HPI   Past Medical History:  Diagnosis Date  . Asthma     Social History   Socioeconomic History  . Marital status: Single    Spouse name: Not on file  . Number of children: Not on file  . Years of education: Not on file  . Highest education level: Not on file  Occupational History  . Not on file  Tobacco Use  . Smoking status: Never Smoker  . Smokeless tobacco: Never Used  Substance and Sexual Activity  . Alcohol use: No  . Drug use: No  . Sexual activity: Not on file  Other Topics Concern  . Not on file  Social History Narrative  . Not on file   Social Determinants of Health   Financial Resource Strain: Not on file  Food Insecurity: Not on file  Transportation Needs: Not on file  Physical Activity: Not on file  Stress: Not on file  Social Connections: Not on file  Intimate Partner Violence: Not on file    No past surgical history on file.  No family  history on file.  No Known Allergies  Current Outpatient Medications on File Prior to Visit  Medication Sig Dispense Refill  . albuterol (VENTOLIN HFA) 108 (90 Base) MCG/ACT inhaler Inhale 1-2 puffs into the lungs every 4 (four) hours as needed for wheezing. Or 30 minutes before exercise. 1 each 2  . cyclobenzaprine (FLEXERIL) 10 MG tablet Take 1 tablet (10 mg total) by mouth 3 (three) times daily as needed for muscle spasms. 30 tablet 0  . methylPREDNISolone (MEDROL DOSEPAK) 4 MG TBPK tablet Take as directed 21 tablet 0   No current facility-administered medications on file prior to visit.    BP 118/76 (BP Location: Left Arm, Patient Position: Sitting, Cuff Size: Large)   Pulse 83   Temp 98.5 F (36.9 C) (Oral)   Ht 5' 10.5" (1.791 m)   Wt 213 lb 9.6 oz (96.9 kg)   SpO2 98%   BMI 30.22 kg/m       Objective:   Physical Exam Vitals and nursing note reviewed.  Constitutional:      Appearance: Normal appearance.  Abdominal:     Hernia: There is no hernia in the left inguinal area or right inguinal area.  Genitourinary:    Pubic Area: No rash.  Penis: Normal.      Testes:        Right: Mass, tenderness, swelling, testicular hydrocele or varicocele not present.        Left: Mass, tenderness, swelling, testicular hydrocele or varicocele not present.     Epididymis:     Right: Normal.     Left: Normal.  Musculoskeletal:        General: Normal range of motion.  Skin:    General: Skin is warm and dry.  Neurological:     General: No focal deficit present.     Mental Status: He is alert and oriented to person, place, and time.  Psychiatric:        Mood and Affect: Mood normal.        Thought Content: Thought content normal.        Judgment: Judgment normal.       Assessment & Plan:  1. Chronic left-sided low back pain with left-sided sciatica  - DG Lumbar Spine 2-3 Views; Future - Consider referral to PT  2. Urinary frequency - Will recheck STD screen and check  UA for acute infection  - Urinalysis; Future - Urine cytology ancillary only; Future - HIV antibody (with reflex); Future - HIV antibody (with reflex) - Urine cytology ancillary only - Urinalysis  3. Testicular discomfort - Resolved  - Completely normal testicular exam  - Follow up PRN   Shirline Frees, NP

## 2020-08-16 LAB — URINE CYTOLOGY ANCILLARY ONLY
Candida Urine: NEGATIVE
Chlamydia: NEGATIVE
Comment: NEGATIVE
Comment: NEGATIVE
Comment: NORMAL
Neisseria Gonorrhea: NEGATIVE
Trichomonas: NEGATIVE

## 2020-08-16 LAB — HIV ANTIBODY (ROUTINE TESTING W REFLEX): HIV 1&2 Ab, 4th Generation: NONREACTIVE

## 2020-12-12 ENCOUNTER — Telehealth: Payer: No Typology Code available for payment source | Admitting: Adult Health

## 2020-12-13 ENCOUNTER — Ambulatory Visit: Payer: No Typology Code available for payment source | Admitting: Family Medicine

## 2020-12-14 ENCOUNTER — Encounter: Payer: Self-pay | Admitting: Family Medicine

## 2020-12-14 ENCOUNTER — Other Ambulatory Visit: Payer: Self-pay

## 2020-12-14 ENCOUNTER — Ambulatory Visit: Payer: No Typology Code available for payment source | Admitting: Family Medicine

## 2020-12-14 VITALS — BP 124/80 | HR 66 | Temp 98.7°F | Wt 210.0 lb

## 2020-12-14 DIAGNOSIS — Z209 Contact with and (suspected) exposure to unspecified communicable disease: Secondary | ICD-10-CM

## 2020-12-14 NOTE — Progress Notes (Signed)
   Subjective:    Patient ID: Adam Jensen., male    DOB: May 23, 1990, 30 y.o.   MRN: [id number]  HPI Here asking for STD testing. He has had no worrisome exposures, but he wants some peace of mind. No symptoms.   Review of Systems  Constitutional: Negative.   Respiratory: Negative.    Cardiovascular: Negative.   Genitourinary: Negative.       Objective:   Physical Exam Constitutional:      Appearance: Normal appearance.  Cardiovascular:     Rate and Rhythm: Normal rate and regular rhythm.     Pulses: Normal pulses.     Heart sounds: Normal heart sounds.  Pulmonary:     Effort: Pulmonary effort is normal.     Breath sounds: Normal breath sounds.  Neurological:     Mental Status: He is alert.          Assessment & Plan:  Exposure to STD's. We will test for Chlamydia, gonorrhea, HIV, syphilis, and Herpes simplex. Gershon Crane, MD

## 2020-12-15 ENCOUNTER — Telehealth: Payer: Self-pay

## 2020-12-15 LAB — HSV(HERPES SIMPLEX VRS) I + II AB-IGG
HAV 1 IGG,TYPE SPECIFIC AB: 0.97 index — ABNORMAL HIGH
HSV 2 IGG,TYPE SPECIFIC AB: <0.9 index

## 2020-12-15 LAB — RPR: RPR Ser Ql: NONREACTIVE

## 2020-12-15 LAB — C. TRACHOMATIS/N. GONORRHOEAE RNA
C. trachomatis RNA, TMA: NOT DETECTED
N. gonorrhoeae RNA, TMA: NOT DETECTED

## 2020-12-15 LAB — HIV ANTIBODY (ROUTINE TESTING W REFLEX): HIV 1&2 Ab, 4th Generation: NONREACTIVE

## 2020-12-15 NOTE — Telephone Encounter (Signed)
Pt called wanting lab results. Per Dr.Jordan pt labs where neg with some exposure to Herp simplex 1. Pt advised of update and verbalized understanding. No further action needed!

## 2020-12-18 ENCOUNTER — Telehealth: Payer: Self-pay | Admitting: Adult Health

## 2020-12-18 NOTE — Telephone Encounter (Signed)
Patient wants to get a refill in the medication that he needs based off his last visit results. He spoke to someone from triage and they advised him to give [place] a call back to get the medication sent to his pharmacy.  The patient could be contacted at [phone].  Please advise.

## 2020-12-18 NOTE — Telephone Encounter (Signed)
Patient called back to discuss  lab results informed patient he would receive a call when results have been reviewed by PCP.

## 2020-12-18 NOTE — Telephone Encounter (Signed)
Patient called in to discuss test results.   Patient could be contacted at [phone].  Please advise.

## 2020-12-18 NOTE — Telephone Encounter (Signed)
Lab results explained to pt.

## 2020-12-20 NOTE — Telephone Encounter (Signed)
Patient called back and stated he would like a  call back he forgot to mention

## 2020-12-20 NOTE — Telephone Encounter (Signed)
Spoke to pt and advised of information regarding labs and Rx. Pt has been scheduled to speak to [facility] for concerns. No further action needed.

## 2020-12-20 NOTE — Telephone Encounter (Signed)
Spoke to pt and he stated his question was answered. No further action needed!

## 2020-12-21 ENCOUNTER — Other Ambulatory Visit: Payer: Self-pay

## 2020-12-21 ENCOUNTER — Telehealth: Payer: Self-pay | Admitting: Adult Health

## 2020-12-21 ENCOUNTER — Encounter: Payer: Self-pay | Admitting: Adult Health

## 2020-12-21 ENCOUNTER — Ambulatory Visit: Payer: No Typology Code available for payment source | Admitting: Adult Health

## 2020-12-21 VITALS — BP 108/78 | HR 80 | Temp 98.8°F | Ht 70.0 in | Wt 212.0 lb

## 2020-12-21 DIAGNOSIS — B009 Herpesviral infection, unspecified: Secondary | ICD-10-CM

## 2020-12-21 DIAGNOSIS — N50811 Right testicular pain: Secondary | ICD-10-CM

## 2020-12-21 LAB — URINALYSIS
Bilirubin Urine: NEGATIVE
Hgb urine dipstick: NEGATIVE
Ketones, ur: NEGATIVE
Leukocytes,Ua: NEGATIVE
Nitrite: NEGATIVE
Specific Gravity, Urine: 1.02 (ref 1.000–1.030)
Total Protein, Urine: NEGATIVE
Urine Glucose: NEGATIVE
Urobilinogen, UA: 0.2 (ref 0.0–1.0)
pH: 6 (ref 5.0–8.0)

## 2020-12-21 MED ORDER — VALACYCLOVIR HCL 500 MG PO TABS: ORAL_TABLET | ORAL | 0 refills | Status: DC

## 2020-12-21 NOTE — Telephone Encounter (Signed)
Called 3 x no answer

## 2020-12-21 NOTE — Progress Notes (Signed)
Subjective:    Patient ID: Adam Jensen., male    DOB: 07-May-1990, 30 y.o.   MRN: [id number]  HPI 30 year old male who was seen by another provider on 12/14/2020 for STD testing.  At this time he had no worrisome exposure but wanted some peace of mind.  He was not experiencing any symptoms.   His results came back negative for HIV, syphilis, chlamydia and gonorrhea.  He was positive for herpes simplex virus 1. He has questions about this diagnosis. Has never had cold sores or sores in his genital area.   He also reports right testicle pain intermittently for the last 7-10 days. At it's worse the pain has been an 8/10 but is usually at a 2/10. Described as an aching pain. Denies trauma, swelling, bruising, lumps or bumps. Has not had any UTI like symptoms   Review of Systems See HPI   Past Medical History:  Diagnosis Date   Asthma     Social History   Socioeconomic History   Marital status: Single    Spouse name: Not on file   Number of children: Not on file   Years of education: Not on file   Highest education level: Not on file  Occupational History   Not on file  Tobacco Use   Smoking status: Never   Smokeless tobacco: Never  Substance and Sexual Activity   Alcohol use: No   Drug use: No   Sexual activity: Not on file  Other Topics Concern   Not on file  Social History Narrative   Not on file   Social Determinants of Health   Financial Resource Strain: Not on file  Food Insecurity: Not on file  Transportation Needs: Not on file  Physical Activity: Not on file  Stress: Not on file  Social Connections: Not on file  Intimate Partner Violence: Not on file    History reviewed. No pertinent surgical history.  History reviewed. No pertinent family history.  No Known Allergies  Current Outpatient Medications on File Prior to Visit  Medication Sig Dispense Refill   albuterol (VENTOLIN HFA) 108 (90 Base) MCG/ACT inhaler Inhale 1-2 puffs into the lungs every 4  (four) hours as needed for wheezing. Or 30 minutes before exercise. 1 each 2   cyclobenzaprine (FLEXERIL) 10 MG tablet Take 1 tablet (10 mg total) by mouth 3 (three) times daily as needed for muscle spasms. 30 tablet 0   No current facility-administered medications on file prior to visit.    BP 108/78   Pulse 80   Temp 98.8 F (37.1 C) (Oral)   Ht 5\' 10"  (1.778 m)   Wt 212 lb (96.2 kg)   SpO2 99%   BMI 30.42 kg/m       Objective:   Physical Exam Vitals and nursing note reviewed.  Constitutional:      Appearance: Normal appearance.  Abdominal:     Hernia: There is no hernia in the right inguinal area.  Genitourinary:    Penis: Normal.      Testes: Cremasteric reflex is present.        Right: Tenderness (mild tenderness with palpation to right testicle) present. Mass, swelling, testicular hydrocele or varicocele not present.     Epididymis:     Right: Normal.  Lymphadenopathy:     Lower Body: No right inguinal adenopathy.  Skin:    General: Skin is warm and dry.     Capillary Refill: Capillary refill takes less than 2  seconds.  Neurological:     General: No focal deficit present.     Mental Status: He is alert and oriented to person, place, and time.  Psychiatric:        Mood and Affect: Mood normal.        Behavior: Behavior normal.        Thought Content: Thought content normal.        Judgment: Judgment normal.       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 He would like valtrex incase he develops a fever blister - valACYclovir (VALTREX) 500 MG tablet; Take one tablet daily for 3-5 days during outbreaks  Dispense: 30 tablet; Refill: 0  2. Pain in right testicle - No signs of hydrocele, variocele, torsion, or cancer. Due to tenderness will order [place]  - US Scrotum; Future - Urinalysis; Future  Shirline Frees, NP   Time spent on chart review, time with patient; discussion of HSV 1 and testicular pain, treatment,  follow up plan, and documentation 30 minutes

## 2020-12-21 NOTE — Addendum Note (Signed)
Addended by: Bonnye Fava on: 12/21/2020 07:35 AM   Modules accepted: Orders

## 2020-12-21 NOTE — Telephone Encounter (Signed)
.  TELEPHONE ENCOUNTER PRIMARY CARE PROVIDER: Shirline Frees, NP  REASON FOR CALL:  Adam Jensen received  a call from [facility] imaging Joni Reining  wanted clarification on the order of the scrotum to want to know if it  should be with or without doppler . The call was sent to me I am  not able to change orders.  I called Joni Reining  back she stated she only wanted a message  to be sent to corey , so I informed her I would send the message to corey to advise   please call [phone] ext [phone]   LAST VISIT IN THIS CLINIC:  12/21/2020 NEXT APPOINTMENT SCHEDULED IN THIS CLINIC: Visit date not found

## 2020-12-21 NOTE — Telephone Encounter (Signed)
Orders given verbally to do the Ultrasound with doppler. No further actions needed.

## 2020-12-21 NOTE — Telephone Encounter (Signed)
Spoke to [place] verbally and he advised with doppler.

## 2020-12-22 ENCOUNTER — Ambulatory Visit
Admission: RE | Admit: 2020-12-22 | Discharge: 2020-12-22 | Disposition: A | Discharge status: Home / Self Care | Payer: No Typology Code available for payment source | Source: Ambulatory Visit | Attending: Adult Health | Admitting: Adult Health

## 2020-12-22 DIAGNOSIS — N50811 Right testicular pain: Secondary | ICD-10-CM

## 2020-12-26 ENCOUNTER — Ambulatory Visit: Payer: No Typology Code available for payment source | Admitting: Adult Health

## 2020-12-27 ENCOUNTER — Telehealth: Payer: Self-pay

## 2020-12-27 NOTE — Telephone Encounter (Signed)
2nd attempt to call pt.  LVM instructions to call back for u/s results.

## 2020-12-27 NOTE — Telephone Encounter (Signed)
Shared normal results with pt. Pt verb understanding & has no further ques/concerns.

## 2020-12-27 NOTE — Telephone Encounter (Signed)
-----   Message from Shirline Frees, NP sent at 12/26/2020  7:07 AM EDT ----- [place] is completely normal

## 2021-01-19 ENCOUNTER — Other Ambulatory Visit: Payer: Self-pay | Admitting: Adult Health

## 2021-01-19 DIAGNOSIS — B009 Herpesviral infection, unspecified: Secondary | ICD-10-CM

## 2021-01-19 NOTE — Telephone Encounter (Signed)
Okay for refill?  

## 2022-06-01 IMAGING — US US SCROTUM W/ DOPPLER COMPLETE
1 series · 14 of 25 positions shown · non-contrast
Comparison: None.

CLINICAL DATA: Right testicular pain.

EXAM:
SCROTAL ULTRASOUND
DOPPLER ULTRASOUND OF THE TESTICLES
TECHNIQUE: Complete ultrasound examination of the testicles, epididymis, and
other scrotal structures was performed. Color and spectral Doppler
ultrasound were also utilized to evaluate blood flow to the
testicles.

[Series 1: us scrotum w/ doppler complete · 0.06mm/px · 14 of 35 slices shown]
[im 1/35]
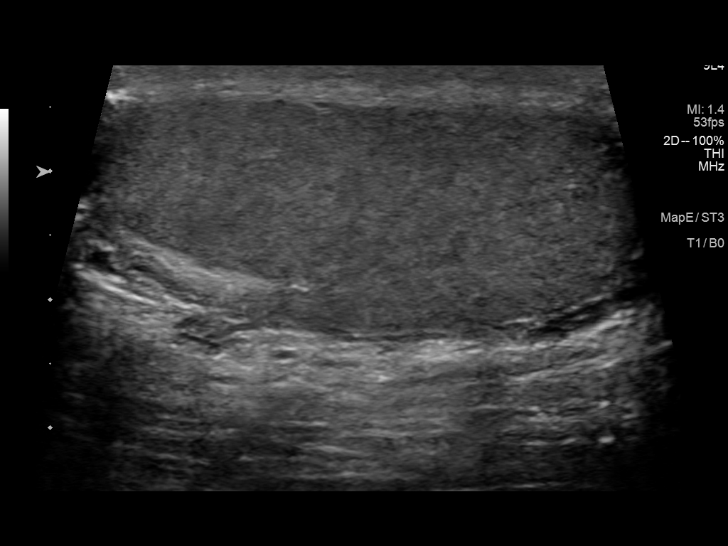
[im 3/35]
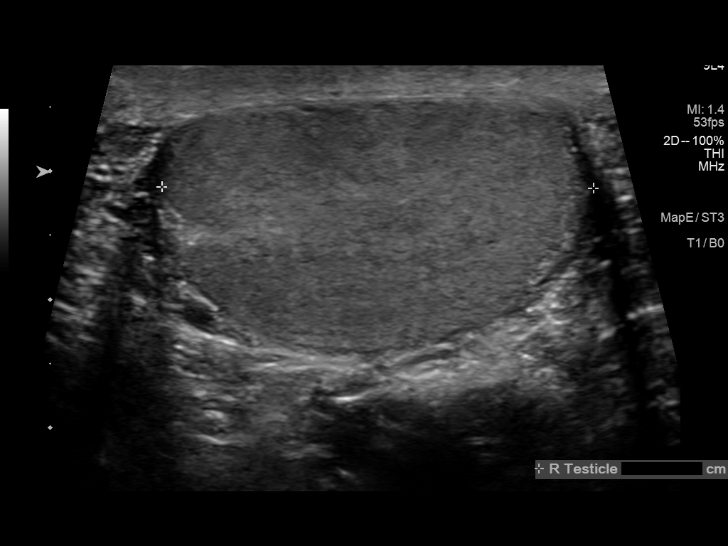
[im 6/35]
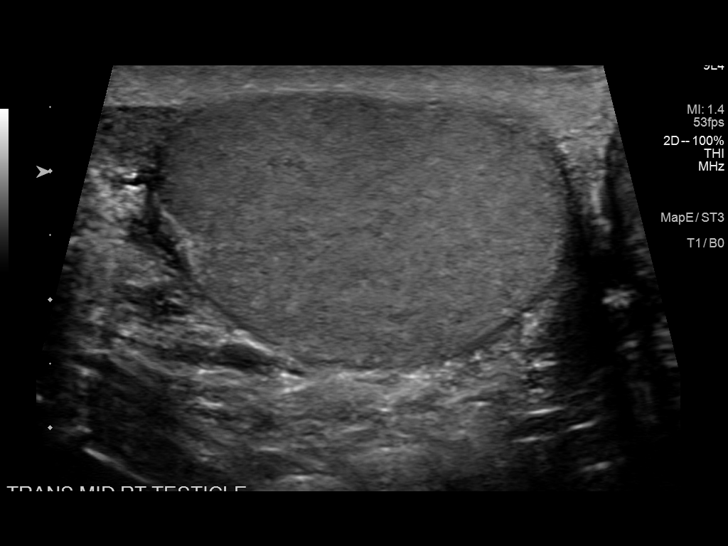
[im 9/35]
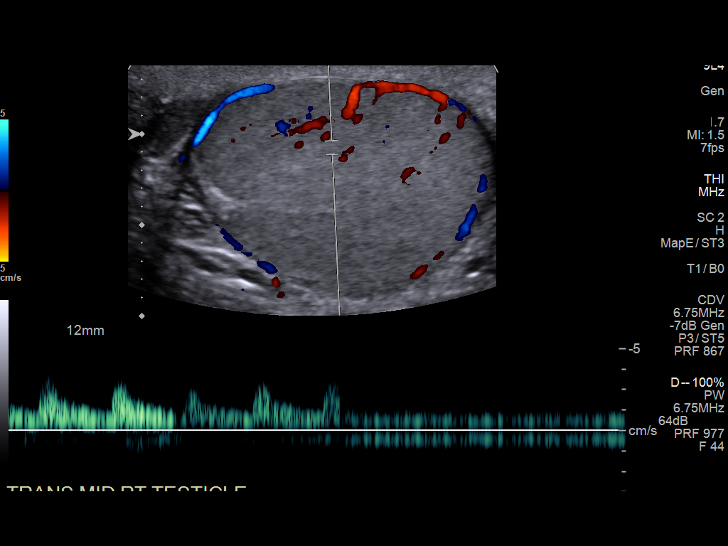
[im 12/35]
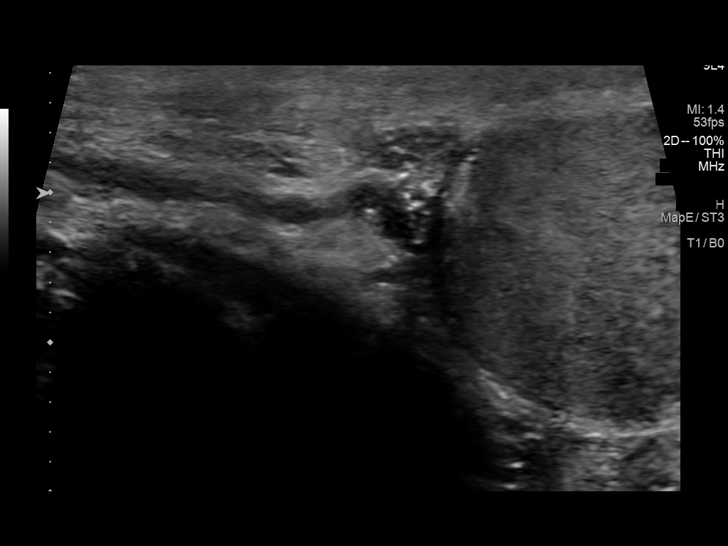
[im 13/35]
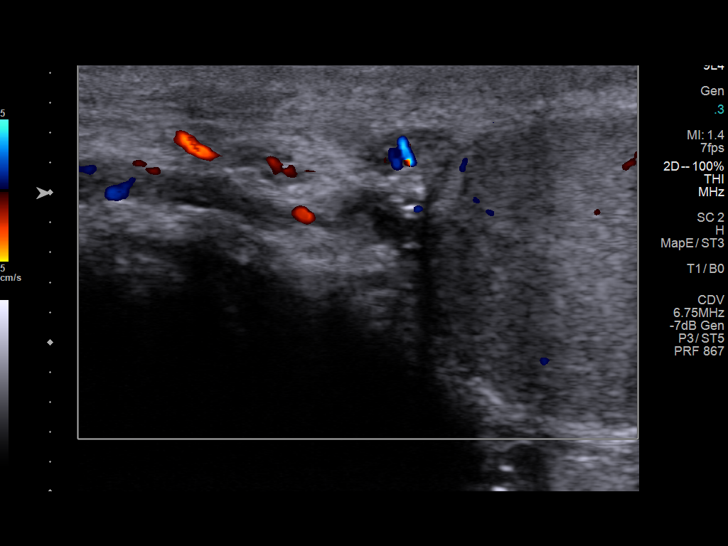
[im 16/35]
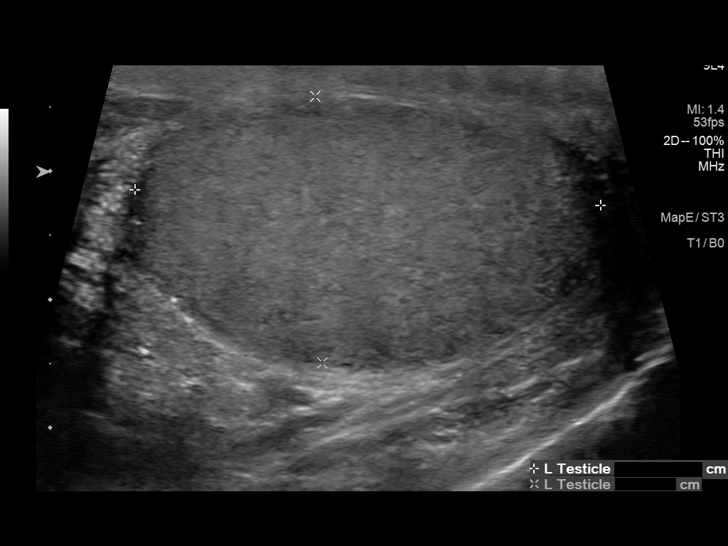
[im 19/35]
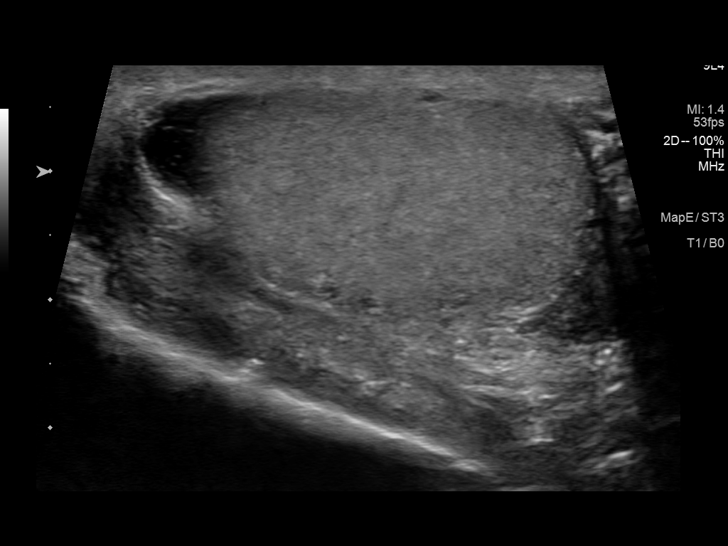
[im 22/35]
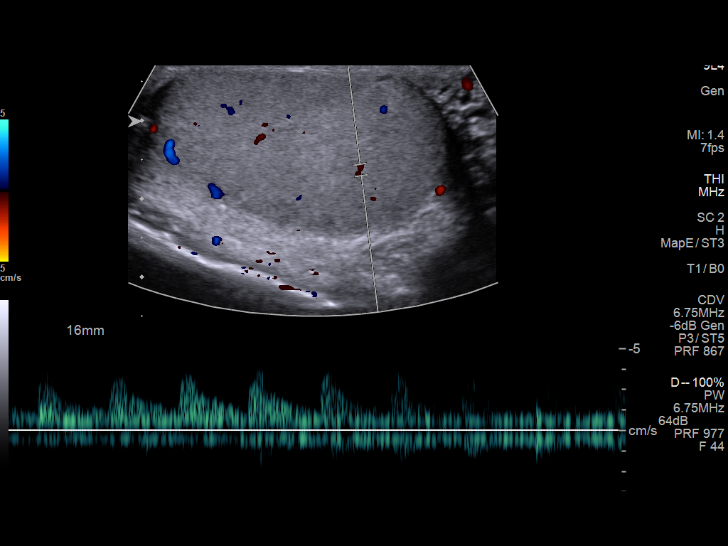
[im 23/35]
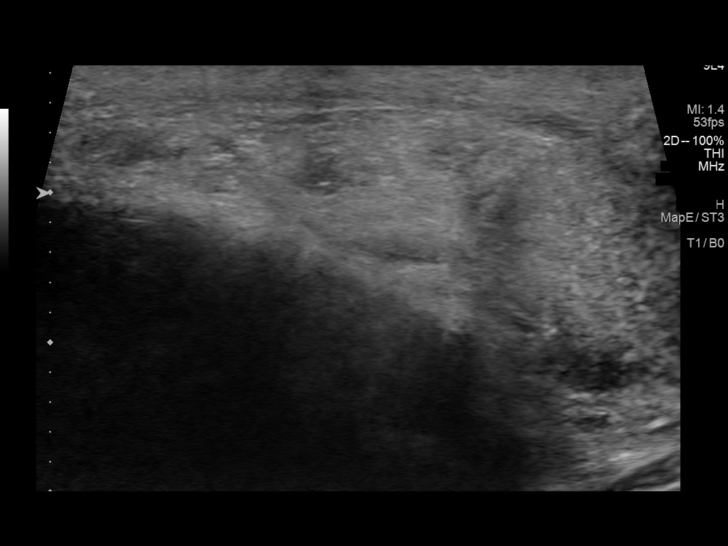
[im 26/35]
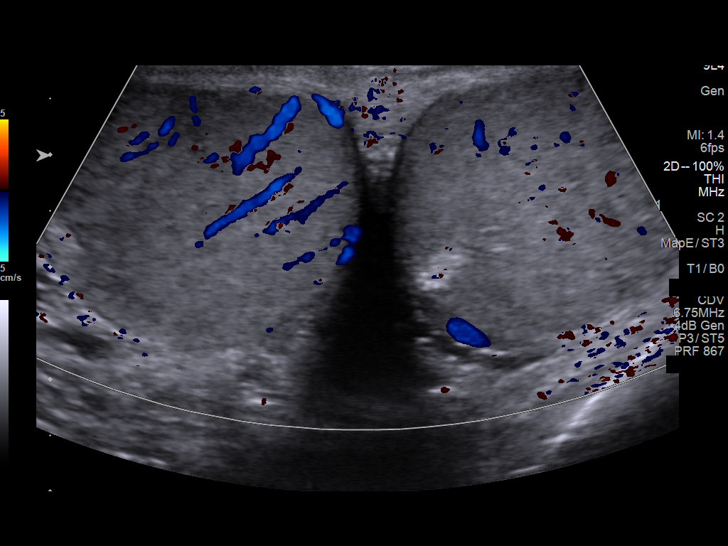
[im 29/35]
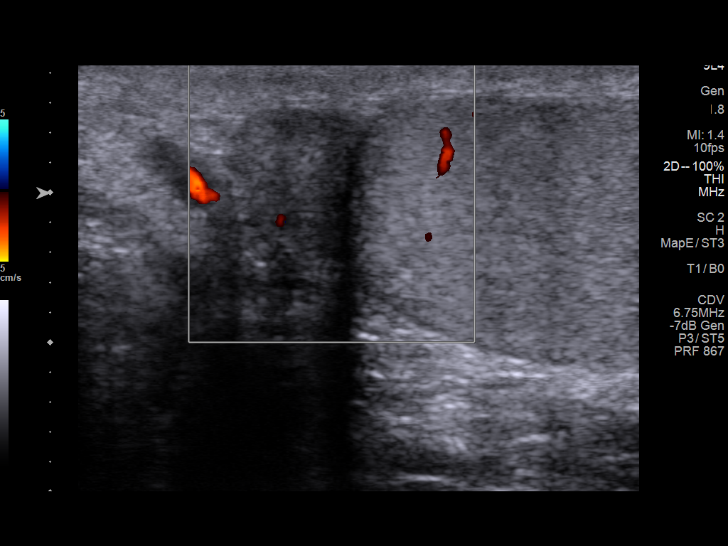
[im 32/35]
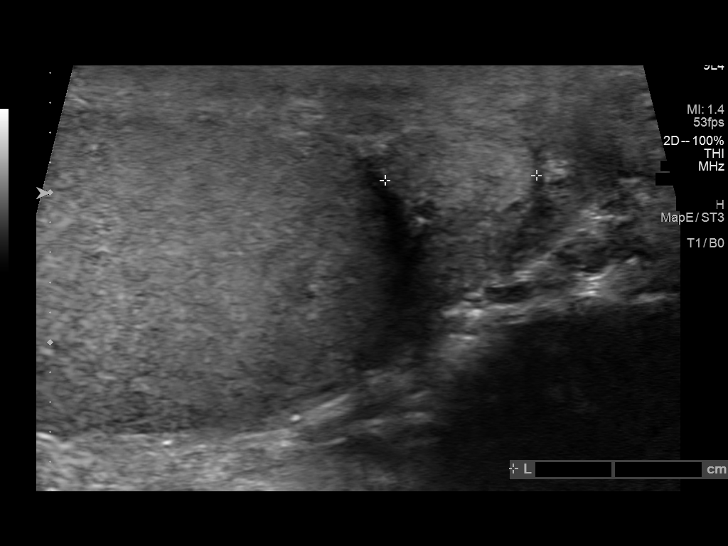
[im 35/35]
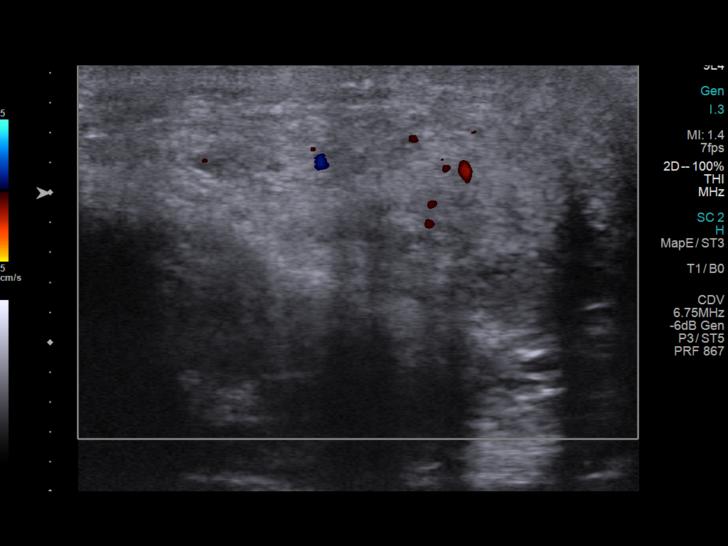

[14 of 25 positions shown; findings below may reference images not displayed]

FINDINGS: Right testicle

Measurements: 4.7 x 3.4 x 2.0 cm. No mass or microlithiasis
visualized.

Left testicle

Measurements: 3.6 x 4.2 x 2.1 cm. No mass or microlithiasis
visualized.

Right epididymis:  Normal in size and appearance.

Left epididymis:  Normal in size and appearance.

Hydrocele:  None visualized.

Varicocele:  None visualized.

Pulsed Doppler interrogation of both testes demonstrates normal low
resistance arterial and venous waveforms bilaterally.
IMPRESSION: No evidence of testicular mass or torsion. No definite abnormality
seen in the scrotum.

## 2022-07-02 ENCOUNTER — Encounter: Payer: Self-pay | Admitting: Adult Health

## 2022-07-02 ENCOUNTER — Ambulatory Visit (INDEPENDENT_AMBULATORY_CARE_PROVIDER_SITE_OTHER): Payer: Commercial Managed Care - PPO | Admitting: Adult Health

## 2022-07-02 VITALS — BP 114/80 | HR 67 | Temp 98.3°F | Ht 70.0 in | Wt 203.4 lb

## 2022-07-02 DIAGNOSIS — Z1159 Encounter for screening for other viral diseases: Secondary | ICD-10-CM | POA: Diagnosis not present

## 2022-07-02 DIAGNOSIS — Z0000 Encounter for general adult medical examination without abnormal findings: Secondary | ICD-10-CM | POA: Diagnosis not present

## 2022-07-02 DIAGNOSIS — J45990 Exercise induced bronchospasm: Secondary | ICD-10-CM | POA: Diagnosis not present

## 2022-07-02 DIAGNOSIS — Z23 Encounter for immunization: Secondary | ICD-10-CM

## 2022-07-02 LAB — LIPID PANEL
Cholesterol: 197 mg/dL (ref 0–200)
HDL: 38.2 mg/dL — ABNORMAL LOW (ref >39.00)
LDL Cholesterol: 147 mg/dL — ABNORMAL HIGH (ref 0–99)
NonHDL: 158.36
Total CHOL/HDL Ratio: 5
Triglycerides: 57 mg/dL (ref 0.0–149.0)
VLDL: 11.4 mg/dL (ref 0.0–40.0)

## 2022-07-02 LAB — COMPREHENSIVE METABOLIC PANEL
ALT: 21 U/L (ref 0–53)
AST: 19 U/L (ref 0–37)
Albumin: 4.9 g/dL (ref 3.5–5.2)
Alkaline Phosphatase: 57 U/L (ref 39–117)
BUN: 10 mg/dL (ref 6–23)
CO2: 26 mEq/L (ref 19–32)
Calcium: 9.8 mg/dL (ref 8.4–10.5)
Chloride: 105 mEq/L (ref 96–112)
Creatinine, Ser: 1.11 mg/dL (ref 0.40–1.50)
GFR: 89.69 mL/min (ref >60.00)
Glucose, Bld: 88 mg/dL (ref 70–99)
Potassium: 4 mEq/L (ref 3.5–5.1)
Sodium: 138 mEq/L (ref 135–145)
Total Bilirubin: 0.7 mg/dL (ref 0.2–1.2)
Total Protein: 7.9 g/dL (ref 6.0–8.3)

## 2022-07-02 LAB — CBC
HCT: 44.1 % (ref 39.0–52.0)
Hemoglobin: 14.2 g/dL (ref 13.0–17.0)
MCHC: 32.2 g/dL (ref 30.0–36.0)
MCV: 71.3 fl — ABNORMAL LOW (ref 78.0–100.0)
Platelets: 242 10*3/uL (ref 150.0–400.0)
RBC: 6.18 Mil/uL — ABNORMAL HIGH (ref 4.22–5.81)
RDW: 14.6 % (ref 11.5–15.5)
WBC: 6.5 10*3/uL (ref 4.0–10.5)

## 2022-07-02 LAB — TSH: TSH: 4.3 u[IU]/mL (ref 0.35–5.50)

## 2022-07-02 MED ORDER — ALBUTEROL SULFATE HFA 108 (90 BASE) MCG/ACT IN AERS: 1.0000 | INHALATION_SPRAY | RESPIRATORY_TRACT | 3 refills | Status: DC | PRN

## 2022-07-02 NOTE — Patient Instructions (Signed)
It was great seeing you today!   We will follow up with you regarding your lab work   I have sent in your albuterol inhaler and updated your tetanus shot   Let me know if you need anything

## 2022-07-02 NOTE — Progress Notes (Signed)
Subjective:    Patient ID: Adam Jensen., male    DOB: 1990/09/28, 32 y.o.   MRN: [id number]  HPI Patient presents for yearly preventative medicine examination. He is a pleasant 32 year old male who  has a past medical history of Asthma.  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 He needs a refill.    All immunizations and health maintenance protocols were reviewed with the patient and needed orders were placed.  Appropriate screening laboratory values were ordered for the patient including screening of hyperlipidemia, renal function and hepatic function.  Medication reconciliation,  past medical history, social history, problem list and allergies were reviewed in detail with the patient  Goals were established with regard to weight loss, exercise, and  diet in compliance with medications Wt Readings from Last 3 Encounters:  07/02/22 203 lb 6.4 oz (92.3 kg)  12/21/20 212 lb (96.2 kg)  12/14/20 210 lb (95.3 kg)   Review of Systems  Constitutional: Negative.   HENT: Negative.    Eyes: Negative.   Respiratory: Negative.    Cardiovascular: Negative.   Gastrointestinal: Negative.   Endocrine: Negative.   Genitourinary: Negative.   Musculoskeletal: Negative.   Skin: Negative.   Allergic/Immunologic: Negative.   Neurological: Negative.   Hematological: Negative.   Psychiatric/Behavioral: Negative.    All other systems reviewed and are negative.  Past Medical History:  Diagnosis Date   Asthma     Social History   Socioeconomic History   Marital status: Single    Spouse name: Not on file   Number of children: Not on file   Years of education: Not on file   Highest education level: Not on file  Occupational History   Not on file  Tobacco Use   Smoking status: Never   Smokeless  tobacco: Never  Substance and Sexual Activity   Alcohol use: No   Drug use: No   Sexual activity: Not on file  Other Topics Concern   Not on file  Social History Narrative   Not on file   Social Determinants of Health   Financial Resource Strain: Not on file  Food Insecurity: Not on file  Transportation Needs: Not on file  Physical Activity: Not on file  Stress: Not on file  Social Connections: Not on file  Intimate Partner Violence: Not on file    History reviewed. No pertinent surgical history.  History reviewed. No pertinent family history.  No Known Allergies  Current Outpatient Medications on File Prior to Visit  Medication Sig Dispense Refill   albuterol (VENTOLIN HFA) 108 (90 Base) MCG/ACT inhaler Inhale 1-2 puffs into the lungs every 4 (four) hours as needed for wheezing. Or 30 minutes before exercise. 1 each 2   cyclobenzaprine (FLEXERIL) 10 MG tablet Take 1 tablet (10 mg total) by mouth 3 (three) times daily as needed for muscle spasms. 30 tablet 0   valACYclovir (VALTREX) 500 MG tablet TAKE ONE TABLET DAILY FOR 3-5 DAYS DURING OUTBREAKS 90 tablet 0   No current facility-administered medications on file prior to visit.    BP 114/80 (BP Location: Left Arm, Patient Position: Sitting, Cuff Size: Large)   Pulse 67   Temp 98.3 F (36.8 C) (Oral)   Ht 5\' 10"  (1.778 m)   Wt 203 lb  6.4 oz (92.3 kg)   SpO2 98%   BMI 29.18 kg/m       Objective:   Physical Exam Vitals and nursing note reviewed.  Constitutional:      General: He is not in acute distress.    Appearance: Normal appearance. He is not ill-appearing.  HENT:     Head: Normocephalic and atraumatic.     Right Ear: Tympanic membrane, ear canal and external ear normal. There is no impacted cerumen.     Left Ear: Tympanic membrane, ear canal and external ear normal. There is no impacted cerumen.     Nose: Nose normal. No congestion or rhinorrhea.     Mouth/Throat:     Mouth: Mucous membranes are moist.      Pharynx: Oropharynx is clear.  Eyes:     Extraocular Movements: Extraocular movements intact.     Conjunctiva/sclera: Conjunctivae normal.     Pupils: Pupils are equal, round, and reactive to light.  Neck:     Vascular: No carotid bruit.  Cardiovascular:     Rate and Rhythm: Normal rate and regular rhythm.     Pulses: Normal pulses.     Heart sounds: No murmur heard.    No friction rub. No gallop.  Pulmonary:     Effort: Pulmonary effort is normal.     Breath sounds: Normal breath sounds.  Abdominal:     General: Abdomen is flat. Bowel sounds are normal. There is no distension.     Palpations: Abdomen is soft. There is no mass.     Tenderness: There is no abdominal tenderness. There is no guarding or rebound.     Hernia: No hernia is present.  Musculoskeletal:        General: Normal range of motion.     Cervical back: Normal range of motion and neck supple.  Lymphadenopathy:     Cervical: No cervical adenopathy.  Skin:    General: Skin is warm and dry.     Capillary Refill: Capillary refill takes less than 2 seconds.  Neurological:     General: No focal deficit present.     Mental Status: He is alert and oriented to person, place, and time.  Psychiatric:        Mood and Affect: Mood normal.        Behavior: Behavior normal.        Thought Content: Thought content normal.        Judgment: Judgment normal.       Assessment & Plan:  1. Routine general medical examination at a health care facility - tdap given today  - Follow up in one year or sooner if needed  - Lipid panel; Future - TSH; Future - CBC; Future - Comprehensive metabolic panel; Future  2. Exercise-induced asthma  - Lipid panel; Future - TSH; Future - CBC; Future - Comprehensive metabolic panel; Future - albuterol (VENTOLIN HFA) 108 (90 Base) MCG/ACT inhaler; Inhale 1-2 puffs into the lungs every 4 (four) hours as needed for wheezing. Or 30 minutes before exercise.  Dispense: 1 each; Refill:  3  3. Need for hepatitis C screening test  - Hep C Antibody; Future  Dorothyann Peng, NP

## 2022-07-03 LAB — HEPATITIS C ANTIBODY: Hepatitis C Ab: NONREACTIVE

## 2023-01-28 ENCOUNTER — Ambulatory Visit (INDEPENDENT_AMBULATORY_CARE_PROVIDER_SITE_OTHER): Payer: Self-pay | Admitting: Adult Health

## 2023-01-28 ENCOUNTER — Encounter: Payer: Self-pay | Admitting: Adult Health

## 2023-01-28 VITALS — BP 102/80 | HR 79 | Temp 98.9°F | Ht 69.75 in | Wt 211.0 lb

## 2023-01-28 DIAGNOSIS — Z113 Encounter for screening for infections with a predominantly sexual mode of transmission: Secondary | ICD-10-CM

## 2023-01-28 NOTE — Progress Notes (Signed)
   Subjective:    Patient ID: Adam Jensen., male    DOB: June 24, 1990, 32 y.o.   MRN: [id number]  HPI  32 year old male who  has a past medical history of Asthma.  He presents to the office today for concern of STD. He reports that he has no signs or symptoms of STDs. He just wants to check and make sure. He has not had any unprotected sex. He is not in any new relationship   Review of Systems See HPI   Past Medical History:  Diagnosis Date   Asthma     Social History   Socioeconomic History   Marital status: Single    Spouse name: Not on file   Number of children: Not on file   Years of education: Not on file   Highest education level: Not on file  Occupational History   Not on file  Tobacco Use   Smoking status: Never   Smokeless tobacco: Never  Substance and Sexual Activity   Alcohol use: No   Drug use: No   Sexual activity: Not on file  Other Topics Concern   Not on file  Social History Narrative   Not on file   Social Determinants of Health   Financial Resource Strain: Not on file  Food Insecurity: Not on file  Transportation Needs: Not on file  Physical Activity: Not on file  Stress: Not on file  Social Connections: Not on file  Intimate Partner Violence: Not on file    No past surgical history on file.  No family history on file.  No Known Allergies  Current Outpatient Medications on File Prior to Visit  Medication Sig Dispense Refill   albuterol (VENTOLIN HFA) 108 (90 Base) MCG/ACT inhaler Inhale 1-2 puffs into the lungs every 4 (four) hours as needed for wheezing. Or 30 minutes before exercise. 1 each 3   valACYclovir (VALTREX) 500 MG tablet TAKE ONE TABLET DAILY FOR 3-5 DAYS DURING OUTBREAKS 90 tablet 0   No current facility-administered medications on file prior to visit.    BP 102/80   Pulse 79   Temp 98.9 F (37.2 C) (Oral)   Ht 5' 9.75" (1.772 m)   Wt 211 lb (95.7 kg)   SpO2 98%   BMI 30.49 kg/m       Objective:   Physical  Exam Vitals and nursing note reviewed.  Constitutional:      Appearance: Normal appearance.  Musculoskeletal:        General: Normal range of motion.  Skin:    General: Skin is warm and dry.  Neurological:     General: No focal deficit present.     Mental Status: He is alert and oriented to person, place, and time.  Psychiatric:        Mood and Affect: Mood normal.        Behavior: Behavior normal.        Thought Content: Thought content normal.        Judgment: Judgment normal.        Assessment & Plan:  1. Screening examination for STD (sexually transmitted disease) - Encouraged safe sex practices - HIV Antibody (routine testing w rflx); Future - RPR; Future - Urine cytology ancillary only; Future  Shirline Frees, NP

## 2023-01-29 LAB — RPR: RPR Ser Ql: NONREACTIVE

## 2023-01-29 LAB — HIV ANTIBODY (ROUTINE TESTING W REFLEX): HIV 1&2 Ab, 4th Generation: NONREACTIVE

## 2023-01-31 ENCOUNTER — Other Ambulatory Visit: Payer: Self-pay

## 2023-02-05 ENCOUNTER — Encounter: Payer: Self-pay | Admitting: Adult Health

## 2023-02-05 ENCOUNTER — Other Ambulatory Visit: Payer: Self-pay

## 2023-02-05 ENCOUNTER — Other Ambulatory Visit (HOSPITAL_COMMUNITY)
Admission: RE | Admit: 2023-02-05 | Discharge: 2023-02-05 | Disposition: A | Discharge status: Home / Self Care | Payer: Self-pay | Source: Ambulatory Visit | Attending: Adult Health | Admitting: Adult Health

## 2023-02-05 ENCOUNTER — Ambulatory Visit (INDEPENDENT_AMBULATORY_CARE_PROVIDER_SITE_OTHER): Payer: Self-pay | Admitting: Adult Health

## 2023-02-05 VITALS — BP 110/80 | HR 82 | Temp 98.3°F | Ht 69.75 in | Wt 216.0 lb

## 2023-02-05 DIAGNOSIS — Z113 Encounter for screening for infections with a predominantly sexual mode of transmission: Secondary | ICD-10-CM

## 2023-02-05 MED ORDER — METRONIDAZOLE 500 MG PO TABS: 2000.0000 mg | ORAL_TABLET | Freq: Once | ORAL | 0 refills | Status: AC

## 2023-02-05 NOTE — Progress Notes (Signed)
Subjective:    Patient ID: Adam Church., male    DOB: 03-Apr-1991, 32 y.o.   MRN: [id number]  HPI  He is being evaluated today for concern of an STD.  Last week he did come in for STD screening and had screening for HIV and syphilis done.  For some reason it sounds like the lab forgot to check his urine cytology.  He reports that sexual partner notified him that he had trichomonas.  Patient has been experiencing pain in his testicles and urinary frequency but has not had any discharge.  Review of Systems See HPI   Past Medical History:  Diagnosis Date   Asthma     Social History   Socioeconomic History   Marital status: Single    Spouse name: Not on file   Number of children: Not on file   Years of education: Not on file   Highest education level: Not on file  Occupational History   Not on file  Tobacco Use   Smoking status: Never   Smokeless tobacco: Never  Substance and Sexual Activity   Alcohol use: No   Drug use: No   Sexual activity: Not on file  Other Topics Concern   Not on file  Social History Narrative   Not on file   Social Determinants of Health   Financial Resource Strain: Not on file  Food Insecurity: Not on file  Transportation Needs: Not on file  Physical Activity: Not on file  Stress: Not on file  Social Connections: Not on file  Intimate Partner Violence: Not on file    History reviewed. No pertinent surgical history.  History reviewed. No pertinent family history.  No Known Allergies  Current Outpatient Medications on File Prior to Visit  Medication Sig Dispense Refill   albuterol (VENTOLIN HFA) 108 (90 Base) MCG/ACT inhaler Inhale 1-2 puffs into the lungs every 4 (four) hours as needed for wheezing. Or 30 minutes before exercise. 1 each 3   valACYclovir (VALTREX) 500 MG tablet TAKE ONE TABLET DAILY FOR 3-5 DAYS DURING OUTBREAKS 90 tablet 0   No current facility-administered medications on file prior to visit.    BP 110/80    Pulse 82   Temp 98.3 F (36.8 C) (Oral)   Ht 5' 9.75" (1.772 m)   Wt 216 lb (98 kg)   SpO2 99%   BMI 31.22 kg/m       Objective:   Physical Exam Vitals and nursing note reviewed.  Constitutional:      Appearance: Normal appearance.  Cardiovascular:     Rate and Rhythm: Normal rate and regular rhythm.     Pulses: Normal pulses.     Heart sounds: Normal heart sounds.  Pulmonary:     Effort: Pulmonary effort is normal.     Breath sounds: Normal breath sounds.  Abdominal:     General: Abdomen is flat. Bowel sounds are normal.     Palpations: Abdomen is soft.     Tenderness: There is no abdominal tenderness.  Musculoskeletal:        General: Normal range of motion.  Skin:    General: Skin is warm and dry.  Neurological:     General: No focal deficit present.     Mental Status: He is alert and oriented to person, place, and time.  Psychiatric:        Behavior: Behavior normal.        Thought Content: Thought content normal.  Judgment: Judgment normal.       Assessment & Plan:   1. Screening examination for STD (sexually transmitted disease) -Will check urine cytology and urinalysis today in the office.  Will treat for trichomonas with 2000 mg of Flagyl once. - metroNIDAZOLE (FLAGYL) 500 MG tablet; Take 4 tablets (2,000 mg total) by mouth once for 1 dose.  Dispense: 4 tablet; Refill: 0 - Urine cytology ancillary only; Future - Urinalysis with Culture Reflex; Future - Urinalysis with Culture Reflex - Urine cytology ancillary only   Shirline Frees, NP

## 2023-02-05 NOTE — Progress Notes (Signed)
   Subjective:    Patient ID: Adam Church., male    DOB: [birth year], 32 y.o.   MRN: [id number]  HPI 32 year old male who  has a past medical history of Asthma.    Review of Systems See HPI   Past Medical History:  Diagnosis Date   Asthma     Social History   Socioeconomic History   Marital status: Single    Spouse name: Not on file   Number of children: Not on file   Years of education: Not on file   Highest education level: Not on file  Occupational History   Not on file  Tobacco Use   Smoking status: Never   Smokeless tobacco: Never  Substance and Sexual Activity   Alcohol use: No   Drug use: No   Sexual activity: Not on file  Other Topics Concern   Not on file  Social History Narrative   Not on file   Social Determinants of Health   Financial Resource Strain: Not on file  Food Insecurity: Not on file  Transportation Needs: Not on file  Physical Activity: Not on file  Stress: Not on file  Social Connections: Not on file  Intimate Partner Violence: Not on file    History reviewed. No pertinent surgical history.  History reviewed. No pertinent family history.  No Known Allergies  Current Outpatient Medications on File Prior to Visit  Medication Sig Dispense Refill   albuterol (VENTOLIN HFA) 108 (90 Base) MCG/ACT inhaler Inhale 1-2 puffs into the lungs every 4 (four) hours as needed for wheezing. Or 30 minutes before exercise. 1 each 3   valACYclovir (VALTREX) 500 MG tablet TAKE ONE TABLET DAILY FOR 3-5 DAYS DURING OUTBREAKS 90 tablet 0   No current facility-administered medications on file prior to visit.    BP 110/80   Pulse 82   Temp 98.3 F (36.8 C) (Oral)   Ht 5' 9.75" (1.772 m)   Wt 216 lb (98 kg)   SpO2 99%   BMI 31.22 kg/m       Objective:   Physical Exam        Assessment & Plan:

## 2023-02-06 LAB — URINALYSIS W MICROSCOPIC + REFLEX CULTURE
Bacteria, UA: NONE SEEN /[HPF]
Bilirubin Urine: NEGATIVE
Glucose, UA: NEGATIVE
Hgb urine dipstick: NEGATIVE
Hyaline Cast: NONE SEEN /[LPF]
Ketones, ur: NEGATIVE
Leukocyte Esterase: NEGATIVE
Nitrites, Initial: NEGATIVE
Protein, ur: NEGATIVE
RBC / HPF: NONE SEEN /[HPF] (ref 0–2)
Specific Gravity, Urine: 1.017 (ref 1.001–1.035)
Squamous Epithelial / HPF: NONE SEEN /[HPF] (ref <=5)
WBC, UA: NONE SEEN /[HPF] (ref 0–5)
pH: 6 (ref 5.0–8.0)

## 2023-02-06 LAB — URINE CYTOLOGY ANCILLARY ONLY
Chlamydia: NEGATIVE
Comment: NEGATIVE
Comment: NEGATIVE
Comment: NORMAL
Neisseria Gonorrhea: NEGATIVE
Trichomonas: NEGATIVE

## 2023-02-06 LAB — NO CULTURE INDICATED

## 2023-02-10 ENCOUNTER — Telehealth: Payer: Self-pay | Admitting: Adult Health

## 2023-02-10 NOTE — Telephone Encounter (Signed)
Prescription Request  02/10/2023  LOV: 02/05/2023  What is the name of the medication or equipment? valACYclovir (VALTREX) 500 MG tablet . Pt was seen on oct 30 and would like cory to return his call  Have you contacted your pharmacy to request a refill? No   Which pharmacy would you like this sent to?   [id number] [place], [place] - [address] [facility] [address] [place] [address] Phone: [phone] Fax: [phone]    Patient notified that their request is being sent to the clinical staff for review and that they should receive a response within 2 business days.   Please advise at Mobile [phone] (mobile)

## 2023-02-11 ENCOUNTER — Other Ambulatory Visit: Payer: Self-pay

## 2023-02-11 DIAGNOSIS — B009 Herpesviral infection, unspecified: Secondary | ICD-10-CM

## 2023-02-11 MED ORDER — VALACYCLOVIR HCL 500 MG PO TABS: ORAL_TABLET | ORAL | 0 refills | Status: DC

## 2023-02-11 NOTE — Telephone Encounter (Signed)
Pt notified that Rx was sent to pharmacy.  

## 2023-03-12 ENCOUNTER — Encounter: Payer: Self-pay | Admitting: Adult Health

## 2023-03-12 ENCOUNTER — Ambulatory Visit (INDEPENDENT_AMBULATORY_CARE_PROVIDER_SITE_OTHER): Payer: BC Managed Care – PPO | Admitting: Adult Health

## 2023-03-12 ENCOUNTER — Telehealth: Payer: Self-pay

## 2023-03-12 ENCOUNTER — Other Ambulatory Visit (HOSPITAL_COMMUNITY)
Admission: RE | Admit: 2023-03-12 | Discharge: 2023-03-12 | Disposition: A | Discharge status: Home / Self Care | Payer: BC Managed Care – PPO | Source: Ambulatory Visit | Attending: Adult Health | Admitting: Adult Health

## 2023-03-12 VITALS — BP 120/80 | HR 75 | Temp 98.5°F | Ht 69.75 in | Wt 218.0 lb

## 2023-03-12 DIAGNOSIS — N50819 Testicular pain, unspecified: Secondary | ICD-10-CM

## 2023-03-12 LAB — POCT URINALYSIS DIPSTICK
Bilirubin, UA: NEGATIVE
Blood, UA: NEGATIVE
Glucose, UA: NEGATIVE
Ketones, UA: NEGATIVE
Leukocytes, UA: NEGATIVE
Nitrite, UA: NEGATIVE
Protein, UA: NEGATIVE
Spec Grav, UA: >=1.03 — AB (ref 1.010–1.025)
Urobilinogen, UA: 0.2 U/dL
pH, UA: 5.5 (ref 5.0–8.0)

## 2023-03-12 NOTE — Telephone Encounter (Signed)
Cytology called - they are not able to run Candida on urine specimens. They will do the other tests still.

## 2023-03-12 NOTE — Addendum Note (Signed)
Addended by: Nancy Fetter on: 03/12/2023 08:47 AM   Modules accepted: Orders

## 2023-03-12 NOTE — Progress Notes (Signed)
Subjective:    Patient ID: Adam Church., male    DOB: 22-Oct-1990, 32 y.o.   MRN: [id number]  HPI 32 year old male who  has a past medical history of Asthma.  He presents to the office today for follow-up regarding STD.  He was last seen on February 05, 2023 at which time he reported that a sexual partner notified him that he had trichomonas.  He had not had any discharge at this time.  Did report urinary frequency and intermittent pain in his testicles.  This time STD screening for HIV, syphilis, trichomonas, gonorrhea and chlamydia were negative.  Today he reports that he has numbness and tingling in his right testicle. He denies urinary frequency, dysuria, discharge. He has not had any trauma.   Review of Systems See HPI   Past Medical History:  Diagnosis Date   Asthma     Social History   Socioeconomic History   Marital status: Single    Spouse name: Not on file   Number of children: Not on file   Years of education: Not on file   Highest education level: Not on file  Occupational History   Not on file  Tobacco Use   Smoking status: Never   Smokeless tobacco: Never  Substance and Sexual Activity   Alcohol use: No   Drug use: No   Sexual activity: Not on file  Other Topics Concern   Not on file  Social History Narrative   Not on file   Social Determinants of Health   Financial Resource Strain: Not on file  Food Insecurity: Not on file  Transportation Needs: Not on file  Physical Activity: Not on file  Stress: Not on file  Social Connections: Not on file  Intimate Partner Violence: Not on file    History reviewed. No pertinent surgical history.  History reviewed. No pertinent family history.  No Known Allergies  Current Outpatient Medications on File Prior to Visit  Medication Sig Dispense Refill   albuterol (VENTOLIN HFA) 108 (90 Base) MCG/ACT inhaler Inhale 1-2 puffs into the lungs every 4 (four) hours as needed for wheezing. Or 30 minutes before  exercise. 1 each 3   valACYclovir (VALTREX) 500 MG tablet Take one tablet daily for 3-5 days during outbreaks 90 tablet 0   No current facility-administered medications on file prior to visit.    BP 120/80   Pulse 75   Temp 98.5 F (36.9 C) (Oral)   Ht 5' 9.75" (1.772 m)   Wt 218 lb (98.9 kg)   SpO2 99%   BMI 31.50 kg/m       Objective:   Physical Exam Vitals and nursing note reviewed.  Constitutional:      Appearance: Normal appearance.  Cardiovascular:     Rate and Rhythm: Normal rate and regular rhythm.     Pulses: Normal pulses.     Heart sounds: Normal heart sounds.  Pulmonary:     Effort: Pulmonary effort is normal.     Breath sounds: Normal breath sounds.  Abdominal:     General: Abdomen is flat. Bowel sounds are normal.     Palpations: Abdomen is soft.     Hernia: There is no hernia in the left inguinal area or right inguinal area.  Genitourinary:    Penis: Normal.      Testes: Normal.        Right: Mass, tenderness, swelling, testicular hydrocele or varicocele not present.  Left: Mass, tenderness, swelling, testicular hydrocele or varicocele not present.     Epididymis:     Right: Normal.     Left: Normal.  Neurological:     General: No focal deficit present.     Mental Status: He is alert and oriented to person, place, and time.  Psychiatric:        Mood and Affect: Mood normal.        Behavior: Behavior normal.        Thought Content: Thought content normal.        Judgment: Judgment normal.        Assessment & Plan:  1. Pain in testicle, unspecified laterality - Normal testicle exam but do to symptoms will order [place].  - POC Urinalysis Dipstick- negative  - Will retest for G/C/T at his request and order [place] of Scrotum - US SCROTUM W/DOPPLER; Future - Urine cytology ancillary only; Future  Shirline Frees, NP  Time spent with patient today was 32 minutes which consisted of chart review, discussing STDs and testicle pain. , work up, treatment  answering questions and documentation.

## 2023-03-14 ENCOUNTER — Ambulatory Visit: Payer: Self-pay | Admitting: Adult Health

## 2023-03-14 ENCOUNTER — Ambulatory Visit
Admission: RE | Admit: 2023-03-14 | Discharge: 2023-03-14 | Disposition: A | Discharge status: Home / Self Care | Payer: BC Managed Care – PPO | Source: Ambulatory Visit | Attending: Adult Health | Admitting: Adult Health

## 2023-03-14 DIAGNOSIS — N503 Cyst of epididymis: Secondary | ICD-10-CM | POA: Diagnosis not present

## 2023-03-14 DIAGNOSIS — N50811 Right testicular pain: Secondary | ICD-10-CM | POA: Diagnosis not present

## 2023-03-14 DIAGNOSIS — N50819 Testicular pain, unspecified: Secondary | ICD-10-CM

## 2023-03-14 LAB — URINE CYTOLOGY ANCILLARY ONLY
Chlamydia: NEGATIVE
Comment: NEGATIVE
Comment: NEGATIVE
Comment: NORMAL
Neisseria Gonorrhea: NEGATIVE
Trichomonas: NEGATIVE

## 2023-03-18 ENCOUNTER — Telehealth: Payer: Self-pay | Admitting: Adult Health

## 2023-03-18 NOTE — Telephone Encounter (Signed)
Updated patient on his [place]. All questions answered

## 2023-11-07 ENCOUNTER — Ambulatory Visit (INDEPENDENT_AMBULATORY_CARE_PROVIDER_SITE_OTHER): Payer: Self-pay | Admitting: Adult Health

## 2023-11-07 ENCOUNTER — Encounter: Payer: Self-pay | Admitting: Adult Health

## 2023-11-07 VITALS — BP 120/82 | HR 64 | Temp 98.4°F | Ht 69.75 in | Wt 199.0 lb

## 2023-11-07 DIAGNOSIS — B009 Herpesviral infection, unspecified: Secondary | ICD-10-CM | POA: Diagnosis not present

## 2023-11-07 DIAGNOSIS — E782 Mixed hyperlipidemia: Secondary | ICD-10-CM | POA: Diagnosis not present

## 2023-11-07 DIAGNOSIS — J45990 Exercise induced bronchospasm: Secondary | ICD-10-CM

## 2023-11-07 DIAGNOSIS — Z23 Encounter for immunization: Secondary | ICD-10-CM | POA: Diagnosis not present

## 2023-11-07 DIAGNOSIS — Z0000 Encounter for general adult medical examination without abnormal findings: Secondary | ICD-10-CM | POA: Diagnosis not present

## 2023-11-07 DIAGNOSIS — Z113 Encounter for screening for infections with a predominantly sexual mode of transmission: Secondary | ICD-10-CM

## 2023-11-07 LAB — CBC
HCT: 44.5 % (ref 39.0–52.0)
Hemoglobin: 14.1 g/dL (ref 13.0–17.0)
MCHC: 31.6 g/dL (ref 30.0–36.0)
MCV: 70.9 fl — ABNORMAL LOW (ref 78.0–100.0)
Platelets: 203 K/uL (ref 150.0–400.0)
RBC: 6.28 Mil/uL — ABNORMAL HIGH (ref 4.22–5.81)
RDW: 15.2 % (ref 11.5–15.5)
WBC: 8.4 K/uL (ref 4.0–10.5)

## 2023-11-07 LAB — COMPREHENSIVE METABOLIC PANEL WITH GFR
ALT: 20 U/L (ref 0–53)
AST: 18 U/L (ref 0–37)
Albumin: 4.6 g/dL (ref 3.5–5.2)
Alkaline Phosphatase: 63 U/L (ref 39–117)
BUN: 15 mg/dL (ref 6–23)
CO2: 27 meq/L (ref 19–32)
Calcium: 9.9 mg/dL (ref 8.4–10.5)
Chloride: 106 meq/L (ref 96–112)
Creatinine, Ser: 1.15 mg/dL (ref 0.40–1.50)
GFR: 85.14 mL/min (ref >60.00)
Glucose, Bld: 96 mg/dL (ref 70–99)
Potassium: 4.2 meq/L (ref 3.5–5.1)
Sodium: 140 meq/L (ref 135–145)
Total Bilirubin: 0.6 mg/dL (ref 0.2–1.2)
Total Protein: 7.6 g/dL (ref 6.0–8.3)

## 2023-11-07 LAB — LIPID PANEL
Cholesterol: 169 mg/dL (ref 0–200)
HDL: 45.9 mg/dL (ref >39.00)
LDL Cholesterol: 112 mg/dL — ABNORMAL HIGH (ref 0–99)
NonHDL: 123.49
Total CHOL/HDL Ratio: 4
Triglycerides: 56 mg/dL (ref 0.0–149.0)
VLDL: 11.2 mg/dL (ref 0.0–40.0)

## 2023-11-07 LAB — TSH: TSH: 2.4 u[IU]/mL (ref 0.35–5.50)

## 2023-11-07 MED ORDER — ALBUTEROL SULFATE HFA 108 (90 BASE) MCG/ACT IN AERS: 1.0000 | INHALATION_SPRAY | RESPIRATORY_TRACT | 3 refills | Status: DC | PRN

## 2023-11-07 MED ORDER — VALACYCLOVIR HCL 500 MG PO TABS: ORAL_TABLET | ORAL | 0 refills | Status: DC

## 2023-11-07 NOTE — Progress Notes (Signed)
 Subjective:    Patient ID: Adam JAYSON Teresa Mickey., male    DOB: 12/07/1990, 33 y.o.   MRN: [id number]  HPI Patient presents for yearly preventative medicine examination. He is a pleasant 33 year old male who  has a past medical history of Asthma.  Exercises Induced asthma - will use an albuterol  inhaler infrequently.  Hyperlipidemia - he has had mildly elevated LDL in the past. 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 Has not had any frequent flares   All immunizations and health maintenance protocols were reviewed with the patient and needed orders were placed. He is going to get his third HPV vaccination today   Appropriate screening laboratory values were ordered for the patient including screening of hyperlipidemia, renal function and hepatic function.  Medication reconciliation,  past medical history, social history, problem list and allergies were reviewed in detail with the patient  Goals were established with regard to weight loss, exercise, and  diet in compliance with medications. He is eating healthier and staying more active with martial arts  Wt Readings from Last 3 Encounters:  11/07/23 199 lb (90.3 kg)  03/12/23 218 lb (98.9 kg)  02/05/23 216 lb (98 kg)    Review of Systems  Constitutional: Negative.   HENT: Negative.    Eyes: Negative.   Respiratory: Negative.    Cardiovascular: Negative.   Gastrointestinal: Negative.   Endocrine: Negative.   Genitourinary: Negative.   Musculoskeletal: Negative.   Skin: Negative.   Allergic/Immunologic: Negative.   Neurological: Negative.   Hematological: Negative.   Psychiatric/Behavioral: Negative.    All other systems reviewed and are  negative.  Past Medical History:  Diagnosis Date   Asthma     Social History   Socioeconomic History   Marital status: Single    Spouse name: Not on file   Number of children: Not on file   Years of education: Not on file   Highest education level: Not on file  Occupational History   Not on file  Tobacco Use   Smoking status: Never   Smokeless tobacco: Never  Substance and Sexual Activity   Alcohol use: No   Drug use: No   Sexual activity: Not on file  Other Topics Concern   Not on file  Social History Narrative   Not on file   Social Drivers of Health   Financial Resource Strain: Not on file  Food Insecurity: Not on file  Transportation Needs: Not on file  Physical Activity: Not on file  Stress: Not on file  Social Connections: Not on file  Intimate Partner Violence: Not on file    History reviewed. No pertinent surgical history.  History reviewed. No pertinent family history.  No Known Allergies  Current Outpatient Medications on File Prior to Visit  Medication Sig Dispense Refill   albuterol  (VENTOLIN  HFA) 108 (90 Base) MCG/ACT inhaler Inhale 1-2 puffs into the lungs every 4 (four) hours as needed for wheezing. Or 30 minutes before exercise. 1 each 3   valACYclovir  (VALTREX ) 500 MG tablet Take one tablet daily for 3-5 days during outbreaks 90 tablet 0   No current facility-administered medications on  file prior to visit.    BP 120/82   Pulse 64   Temp 98.4 F (36.9 C) (Oral)   Ht 5' 9.75 (1.772 m)   Wt 199 lb (90.3 kg)   SpO2 98%   BMI 28.76 kg/m       Objective:   Physical Exam Vitals and nursing note reviewed.  Constitutional:      General: He is not in acute distress.    Appearance: Normal appearance. He is not ill-appearing.  HENT:     Head: Normocephalic and atraumatic.     Right Ear: Tympanic membrane, ear canal and external ear normal. There is no impacted cerumen.     Left Ear: Tympanic membrane, ear canal and external ear normal.  There is no impacted cerumen.     Nose: Nose normal. No congestion or rhinorrhea.     Mouth/Throat:     Mouth: Mucous membranes are moist.     Pharynx: Oropharynx is clear.  Eyes:     Extraocular Movements: Extraocular movements intact.     Conjunctiva/sclera: Conjunctivae normal.     Pupils: Pupils are equal, round, and reactive to light.  Neck:     Vascular: No carotid bruit.  Cardiovascular:     Rate and Rhythm: Normal rate and regular rhythm.     Pulses: Normal pulses.     Heart sounds: No murmur heard.    No friction rub. No gallop.  Pulmonary:     Effort: Pulmonary effort is normal.     Breath sounds: Normal breath sounds.  Abdominal:     General: Abdomen is flat. Bowel sounds are normal. There is no distension.     Palpations: Abdomen is soft. There is no mass.     Tenderness: There is no abdominal tenderness. There is no guarding or rebound.     Hernia: No hernia is present.  Musculoskeletal:        General: Normal range of motion.     Cervical back: Normal range of motion and neck supple.  Lymphadenopathy:     Cervical: No cervical adenopathy.  Skin:    General: Skin is warm and dry.     Capillary Refill: Capillary refill takes less than 2 seconds.  Neurological:     General: No focal deficit present.     Mental Status: He is alert and oriented to person, place, and time.  Psychiatric:        Mood and Affect: Mood normal.        Behavior: Behavior normal.        Thought Content: Thought content normal.        Judgment: Judgment normal.        Assessment & Plan:  1. Routine general medical examination at a health care facility (Primary) Today patient counseled on age appropriate routine health concerns for screening and prevention, each reviewed and up to date or declined. Immunizations reviewed and up to date or declined. Labs ordered and reviewed. Risk factors for depression reviewed and negative. Hearing function and visual acuity are intact. ADLs screened and  addressed as needed. Functional ability and level of safety reviewed and appropriate. Education, counseling and referrals performed based on assessed risks today. Patient provided with a copy of personalized plan for preventive services. - Continue to eat healthy and exercise - Follow up in one year or sooner if needed   2. Exercise-induced asthma  - albuterol  (VENTOLIN  HFA) 108 (90 Base) MCG/ACT inhaler; Inhale 1-2 puffs into the lungs every 4 (  four) hours as needed for wheezing. Or 30 minutes before exercise.  Dispense: 1 each; Refill: 3  3. 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 Screening examination for STD (sexually transmitted disease)  - HIV Antibody (routine testing w rflx); Future - RPR; Future - Urine cytology ancillary only   6. Need for HPV vaccine  - HPV 9-valent vaccine,Recombinat   Darleene Shape, NP

## 2023-11-07 NOTE — Patient Instructions (Signed)
 It was great seeing you today   We will follow up with you regarding your lab work   Please let me know if you need anything

## 2023-11-08 LAB — HIV ANTIBODY (ROUTINE TESTING W REFLEX): HIV 1&2 Ab, 4th Generation: NONREACTIVE

## 2023-11-08 LAB — RPR: RPR Ser Ql: NONREACTIVE

## 2023-11-11 ENCOUNTER — Ambulatory Visit: Payer: Self-pay | Admitting: Adult Health

## 2023-11-13 ENCOUNTER — Telehealth: Payer: Self-pay | Admitting: *Deleted

## 2023-11-13 NOTE — Telephone Encounter (Signed)
 Copied from CRM [id number]. Topic: Clinical - Medical Advice >> Nov 13, 2023  1:45 PM Mercedes MATSU wrote: Reason for CRM: Patient is requesting medication for Urea Plasma since his patient has tested positive. He stated that he was just seen for something else so he declined the appointment and s requesting the medication only. Patient denies any symptoms. Patient is requesting a call back to know if Darleene will send the medication [phone]

## 2023-11-14 MED ORDER — DOXYCYCLINE HYCLATE 100 MG PO TABS: 100.0000 mg | ORAL_TABLET | Freq: Two times a day (BID) | ORAL | 0 refills | Status: AC

## 2023-11-14 NOTE — Telephone Encounter (Signed)
Noted! Rx sent to pharmacy. Pt notified of update 

## 2023-11-14 NOTE — Addendum Note (Signed)
 Addended by: VICCI LEADER R on: 11/14/2023 10:37 AM   Modules accepted: Orders

## 2023-11-14 NOTE — Telephone Encounter (Signed)
 Called pt for more understanding but no answer. Will try again later.

## 2023-11-14 NOTE — Telephone Encounter (Signed)
 Spoke to pt and he stated that his gf tested positive for ureaplasma. Pt was advised by his gf provider to reach out to pcp for an abx. To stop the transmission from both partners. Pt wanted to let Darleene know that his gf provider sent her in Doxycycline .

## 2023-11-14 NOTE — Telephone Encounter (Signed)
 Left message to return phone call.

## 2023-12-23 ENCOUNTER — Encounter: Payer: Self-pay | Admitting: Adult Health

## 2023-12-23 ENCOUNTER — Other Ambulatory Visit (HOSPITAL_COMMUNITY)
Admission: RE | Admit: 2023-12-23 | Discharge: 2023-12-23 | Disposition: A | Discharge status: Home / Self Care | Payer: Self-pay | Source: Ambulatory Visit | Attending: Adult Health | Admitting: Adult Health

## 2023-12-23 ENCOUNTER — Ambulatory Visit (INDEPENDENT_AMBULATORY_CARE_PROVIDER_SITE_OTHER): Payer: Self-pay | Admitting: Adult Health

## 2023-12-23 VITALS — BP 120/70 | HR 82 | Temp 98.7°F | Ht 69.75 in | Wt 216.0 lb

## 2023-12-23 DIAGNOSIS — Z113 Encounter for screening for infections with a predominantly sexual mode of transmission: Secondary | ICD-10-CM | POA: Insufficient documentation

## 2023-12-23 NOTE — Progress Notes (Signed)
 Subjective:    Patient ID: Adam Jensen., male    DOB: [birth year], 33 y.o.   MRN: [id number]  HPI 32 year old male who is being evaluated today for follow-up.  Last month he sent us  a message that his girlfriend had tested positive for Ureaplasma infection.  Patient was advised by his girlfriend's provider to reach out for treatment so that we both can be tested at the same time.  We did send in doxycycline  for him.  Today he reports that his girlfriend retested and she was still positive.  Overall he denies symptoms, maybe has some discomfort when he urinates but this did not start until after he was reading about the infection on the Internet.  He denies any discharge, fevers or chills.   He would like full STD testing done today.   Review of Systems See HPI   Past Medical History:  Diagnosis Date   Asthma     Social History   Socioeconomic History   Marital status: Single    Spouse name: Not on file   Number of children: Not on file   Years of education: Not on file   Highest education level: Not on file  Occupational History   Not on file  Tobacco Use   Smoking status: Never   Smokeless tobacco: Never  Substance and Sexual Activity   Alcohol use: No   Drug use: No   Sexual activity: Not on file  Other Topics Concern   Not on file  Social History Narrative   Not on file   Social Drivers of Health   Financial Resource Strain: Not on file  Food Insecurity: Not on file  Transportation Needs: Not on file  Physical Activity: Not on file  Stress: Not on file  Social Connections: Not on file  Intimate Partner Violence: Not on file    No past surgical history on file.  No family history on file.  No Known Allergies  Current Outpatient Medications on File Prior to Visit  Medication Sig Dispense Refill   albuterol  (VENTOLIN  HFA) 108 (90 Base) MCG/ACT inhaler Inhale 1-2 puffs into the lungs every 4 (four) hours as needed for wheezing. Or 30 minutes before  exercise. 1 each 3   valACYclovir  (VALTREX ) 500 MG tablet Take one tablet daily for 3-5 days during outbreaks 90 tablet 0   No current facility-administered medications on file prior to visit.    BP 120/70   Pulse 82   Temp 98.7 F (37.1 C) (Oral)   Ht 5' 9.75 (1.772 m)   Wt 216 lb (98 kg)   SpO2 99%   BMI 31.22 kg/m       Objective:   Physical Exam Vitals and nursing note reviewed.  Constitutional:      Appearance: Normal appearance.  Cardiovascular:     Rate and Rhythm: Normal rate and regular rhythm.     Pulses: Normal pulses.     Heart sounds: Normal heart sounds.  Pulmonary:     Effort: Pulmonary effort is normal.     Breath sounds: Normal breath sounds.  Musculoskeletal:        General: Normal range of motion.  Skin:    General: Skin is warm and dry.  Neurological:     General: No focal deficit present.     Mental Status: He is alert and oriented to person, place, and time.  Psychiatric:        Mood and Affect: Mood normal.  Behavior: Behavior normal.        Thought Content: Thought content normal.        Judgment: Judgment normal.           Assessment & Plan:  1. Screening examination for STD (sexually transmitted disease) (Primary)  - Mycoplasma / Ureaplasma Culture; Future - Urine cytology ancillary only - HIV antibody (with reflex); Future - RPR; Future   Darleene Shape, NP

## 2023-12-24 ENCOUNTER — Ambulatory Visit: Payer: Self-pay | Admitting: Adult Health

## 2023-12-24 LAB — HIV ANTIBODY (ROUTINE TESTING W REFLEX)
HIV 1&2 Ab, 4th Generation: NONREACTIVE
HIV FINAL INTERPRETATION: NEGATIVE

## 2023-12-24 LAB — RPR: RPR Ser Ql: NONREACTIVE

## 2023-12-25 LAB — URINE CYTOLOGY ANCILLARY ONLY
Chlamydia: NEGATIVE
Comment: NEGATIVE
Comment: NEGATIVE
Comment: NORMAL
Neisseria Gonorrhea: NEGATIVE
Trichomonas: NEGATIVE

## 2023-12-31 LAB — MYCOPLASMA / UREAPLASMA CULTURE

## 2024-02-03 ENCOUNTER — Telehealth: Payer: Self-pay | Admitting: Adult Health

## 2024-02-03 DIAGNOSIS — J45990 Exercise induced bronchospasm: Secondary | ICD-10-CM

## 2024-02-03 DIAGNOSIS — B009 Herpesviral infection, unspecified: Secondary | ICD-10-CM

## 2024-02-03 MED ORDER — ALBUTEROL SULFATE HFA 108 (90 BASE) MCG/ACT IN AERS: 1.0000 | INHALATION_SPRAY | RESPIRATORY_TRACT | 3 refills | Status: AC | PRN

## 2024-02-03 MED ORDER — VALACYCLOVIR HCL 500 MG PO TABS: ORAL_TABLET | ORAL | 0 refills | Status: AC

## 2024-02-03 NOTE — Telephone Encounter (Signed)
 Copied from CRM [id number]. Topic: Clinical - Medication Refill >> Feb 03, 2024  1:16 PM Alfonso HERO wrote: Medication: albuterol  (VENTOLIN  HFA) 108 (90 Base) MCG/ACT inhaler valACYclovir  (VALTREX ) 500 MG tablet   Has the patient contacted their pharmacy? Yes (Agent: If no, request that the patient contact the pharmacy for the refill. If patient does not wish to contact the pharmacy document the reason why and proceed with request.) (Agent: If yes, when and what did the pharmacy advise?)  This is the patient's preferred pharmacy:  [id number] - [place], [place] - [address] AT [facility] [address] [place] [address] Phone: [phone] Fax: [phone]  Is this the correct pharmacy for this prescription? Yes If no, delete pharmacy and type the correct one.   Has the prescription been filled recently? Yes  Is the patient out of the medication? Yes  Has the patient been seen for an appointment in the last year OR does the patient have an upcoming appointment? Yes  Can we respond through MyChart? Yes  Agent: Please be advised that Rx refills may take up to 3 business days. We ask that you follow-up with your pharmacy.

## 2024-03-26 ENCOUNTER — Telehealth: Payer: Self-pay

## 2024-03-26 NOTE — Telephone Encounter (Signed)
 Pt is correct called pt on accident I called pt to clear up confusion. No further action needed.

## 2024-03-26 NOTE — Telephone Encounter (Signed)
 Patient returned call and stated that he is not scheduled to have any surgeries or anything done. He's not sure what he needs a surgical clearance for.

## 2024-03-26 NOTE — Telephone Encounter (Signed)
Called pt to schedule a surgical clearance.

## 2024-04-21 ENCOUNTER — Encounter: Payer: Self-pay | Admitting: Adult Health

## 2024-04-21 ENCOUNTER — Ambulatory Visit: Payer: Self-pay | Admitting: Adult Health

## 2024-04-21 VITALS — BP 110/90 | HR 72 | Temp 98.8°F | Ht 69.75 in | Wt 219.0 lb

## 2024-04-21 DIAGNOSIS — Z113 Encounter for screening for infections with a predominantly sexual mode of transmission: Secondary | ICD-10-CM

## 2024-04-21 DIAGNOSIS — G8929 Other chronic pain: Secondary | ICD-10-CM | POA: Diagnosis not present

## 2024-04-21 DIAGNOSIS — R1031 Right lower quadrant pain: Secondary | ICD-10-CM

## 2024-04-21 NOTE — Progress Notes (Signed)
 "  Subjective:    Patient ID: Adam Jensen., male    DOB: May 03, 1990, 34 y.o.   MRN: [id number]  HPI  Discussed the use of AI scribe software for clinical note transcription with the patient, who gave verbal consent to proceed.  History of Present Illness   Adam Jensen. is a 34 year old male who presents with right-sided groin pain and concerns about possible ureaplasma infection.  He has had right groin pain for about four months. The pain is a lingering tightness, rated 4/10, localized to the right groin without radiation and distinct from past testicular pain. It is more noticeable when he is inactive. 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  In September his girlfriend tested positive for ureaplasma. He tested negative at that time but is worried he may have since contracted it, as her issues have persisted. He has no urethral discharge or dysuria. He would like repeat testing for ureaplasma and screening for gonorrhea, chlamydia, HIV, and syphilis.       Review of Systems See HPI   Past Medical History:  Diagnosis Date   Asthma     Social History   Socioeconomic History   Marital status: Single    Spouse name: Not on file   Number of children: Not on file   Years of education: Not on file   Highest education level: Not on file  Occupational History   Not on file  Tobacco Use   Smoking status: Never   Smokeless tobacco: Never  Substance and Sexual Activity   Alcohol use: No   Drug use: No   Sexual activity: Not on file  Other Topics Concern   Not on file  Social History Narrative   Not on file   Social Drivers of Health   Tobacco Use: Low Risk (04/21/2024)   Patient History    Smoking Tobacco Use: Never    Smokeless Tobacco Use: Never    Passive Exposure: Not on file  Financial Resource Strain: Not on file  Food Insecurity: Not on file   Transportation Needs: Not on file  Physical Activity: Not on file  Stress: Not on file  Social Connections: Not on file  Intimate Partner Violence: Not on file  Depression (EYV7-0): Medium Risk (03/12/2023)   Depression (PHQ2-9)    PHQ-2 Score: 5  Alcohol Screen: Not on file  Housing: Not on file  Utilities: Not on file  Health Literacy: Not on file    No past surgical history on file.  No family history on file.  Allergies[1]  Medications Ordered Prior to Encounter[2]  BP (!) 110/90   Pulse 72   Temp 98.8 F (37.1 C) (Oral)   Ht 5' 9.75 (1.772 m)   Wt 219 lb (99.3 kg)   SpO2 98%   BMI 31.65 kg/m       Objective:   Physical Exam Vitals and nursing note reviewed.  Constitutional:      Appearance: Normal appearance.  Cardiovascular:     Rate and Rhythm: Normal rate and regular rhythm.     Pulses: Normal pulses.     Heart sounds: Normal heart sounds.  Pulmonary:     Effort: Pulmonary effort is normal.     Breath sounds: Normal breath sounds.  Abdominal:     General: Abdomen is flat. Bowel sounds are normal.     Palpations:  Abdomen is soft.  Genitourinary:    Penis: Normal.      Testes: Normal.    Lymphadenopathy:     Lower Body: No right inguinal adenopathy. No left inguinal adenopathy.  Skin:    General: Skin is warm and dry.  Neurological:     General: No focal deficit present.     Mental Status: He is oriented to person, place, and time.  Psychiatric:        Mood and Affect: Mood normal.        Behavior: Behavior normal.        Thought Content: Thought content normal.        Judgment: Judgment normal.        Assessment & Plan:  Assessment and Plan    Right groin pain Chronic pain for 4-5 months, exacerbated by palpation. Possible muscle strain but it has been going on for quite a while. Less likely prostatitis. Need to r/o abscess, cyst or other abnormality  Atypical symptoms for STI - Ordered CT scan of abdomen and pelvis.  STD  screening  - Scheduled lab work for ureaplasma, mycoplasma, gonorrhea, chlamydia, HIV, and syphilis testing.      Kambrey Hagger, NP      [1] No Known Allergies [2]  Current Outpatient Medications on File Prior to Visit  Medication Sig Dispense Refill   albuterol  (VENTOLIN  HFA) 108 (90 Base) MCG/ACT inhaler Inhale 1-2 puffs into the lungs every 4 (four) hours as needed for wheezing. Or 30 minutes before exercise. 1 each 3   valACYclovir  (VALTREX ) 500 MG tablet Take one tablet daily for 3-5 days during outbreaks 90 tablet 0   No current facility-administered medications on file prior to visit.   "

## 2024-04-22 ENCOUNTER — Other Ambulatory Visit (HOSPITAL_COMMUNITY)
Admission: RE | Admit: 2024-04-22 | Discharge: 2024-04-22 | Disposition: A | Discharge status: Home / Self Care | Payer: PRIVATE HEALTH INSURANCE | Source: Ambulatory Visit | Attending: Adult Health | Admitting: Adult Health

## 2024-04-22 ENCOUNTER — Ambulatory Visit: Payer: Self-pay | Admitting: Adult Health

## 2024-04-22 ENCOUNTER — Other Ambulatory Visit: Payer: PRIVATE HEALTH INSURANCE

## 2024-04-22 ENCOUNTER — Telehealth: Payer: Self-pay | Admitting: Adult Health

## 2024-04-22 ENCOUNTER — Other Ambulatory Visit: Payer: Self-pay | Admitting: Adult Health

## 2024-04-22 DIAGNOSIS — Z113 Encounter for screening for infections with a predominantly sexual mode of transmission: Secondary | ICD-10-CM

## 2024-04-22 LAB — BASIC METABOLIC PANEL WITH GFR
BUN: 15 mg/dL (ref 6–23)
CO2: 29 meq/L (ref 19–32)
Calcium: 9.8 mg/dL (ref 8.4–10.5)
Chloride: 104 meq/L (ref 96–112)
Creatinine, Ser: 1.19 mg/dL (ref 0.40–1.50)
GFR: 81.46 mL/min
Glucose, Bld: 86 mg/dL (ref 70–99)
Potassium: 4.4 meq/L (ref 3.5–5.1)
Sodium: 139 meq/L (ref 135–145)

## 2024-04-22 LAB — PSA: PSA: 0.58 ng/mL (ref 0.10–4.00)

## 2024-04-22 NOTE — Telephone Encounter (Signed)
 Pt stated his insurance is not in network with radiology and he needs referral sent somewhere that is in network.

## 2024-04-22 NOTE — Telephone Encounter (Signed)
 Okay for referral?

## 2024-04-22 NOTE — Telephone Encounter (Signed)
 Patient notified of update  and verbalized understanding.

## 2024-04-23 ENCOUNTER — Telehealth: Payer: Self-pay

## 2024-04-23 LAB — URINE CYTOLOGY ANCILLARY ONLY
Chlamydia: NEGATIVE
Comment: NEGATIVE
Comment: NEGATIVE
Comment: NORMAL
Neisseria Gonorrhea: NEGATIVE
Trichomonas: NEGATIVE

## 2024-04-23 LAB — HIV ANTIBODY (ROUTINE TESTING W REFLEX)
HIV 1&2 Ab, 4th Generation: NONREACTIVE
HIV FINAL INTERPRETATION: NEGATIVE

## 2024-04-23 LAB — EXTRA URINE SPECIMEN

## 2024-04-23 LAB — MYCOPLASMA / UREAPLASMA HOMINIS CULTURE

## 2024-04-23 LAB — SYPHILIS: RPR W/REFLEX TO RPR TITER AND TREPONEMAL ANTIBODIES, TRADITIONAL SCREENING AND DIAGNOSIS ALGORITHM: RPR Ser Ql: NONREACTIVE

## 2024-04-23 NOTE — Telephone Encounter (Signed)
 Pt called clinic himself to set this up since they were in network. Clinic is asking for referral and notes.   Can you help with this.

## 2024-04-23 NOTE — Telephone Encounter (Signed)
 Copied from CRM [id number]. Topic: Appointments - Appointment Info/Confirmation >> Apr 23, 2024 11:39 AM Montie POUR wrote: Patient/patient representative is calling for information regarding an appointment.  [facility] Imaging Department; [facility]; [place], [place] Phone #[phone] - Fax #[phone] Please fax the order for CT ABDOMEN PELVIS W CONTRAST Please call Denson with any questions at [phone] Thanks

## 2024-04-28 ENCOUNTER — Other Ambulatory Visit: Payer: PRIVATE HEALTH INSURANCE

## 2024-04-29 ENCOUNTER — Other Ambulatory Visit: Payer: PRIVATE HEALTH INSURANCE

## 2024-05-11 ENCOUNTER — Telehealth: Payer: Self-pay | Admitting: Adult Health
